# Patient Record
Sex: Male | Born: 1960 | Race: White | Hispanic: No | Marital: Married | Smoking: Former smoker
Health system: Southern US, Community
[De-identification: ages and names within clinical notes are randomized; demographics above are authoritative.]

## PROBLEM LIST (undated history)

## (undated) DIAGNOSIS — M545 Low back pain: Secondary | ICD-10-CM

## (undated) DIAGNOSIS — IMO0002 Reserved for concepts with insufficient information to code with codable children: Secondary | ICD-10-CM

## (undated) DIAGNOSIS — D2330 Other benign neoplasm of skin of unspecified part of face: Secondary | ICD-10-CM

## (undated) DIAGNOSIS — I10 Essential (primary) hypertension: Secondary | ICD-10-CM

## (undated) DIAGNOSIS — M48061 Spinal stenosis, lumbar region without neurogenic claudication: Secondary | ICD-10-CM

## (undated) DIAGNOSIS — D2360 Other benign neoplasm of skin of unspecified upper limb, including shoulder: Secondary | ICD-10-CM

## (undated) DIAGNOSIS — J45909 Unspecified asthma, uncomplicated: Secondary | ICD-10-CM

## (undated) DIAGNOSIS — Z9889 Other specified postprocedural states: Secondary | ICD-10-CM

## (undated) DIAGNOSIS — E785 Hyperlipidemia, unspecified: Secondary | ICD-10-CM

## (undated) DIAGNOSIS — R112 Nausea with vomiting, unspecified: Secondary | ICD-10-CM

## (undated) HISTORY — DX: Spinal stenosis, lumbar region without neurogenic claudication: M48.061

## (undated) HISTORY — DX: Low back pain: M54.5

## (undated) HISTORY — DX: Nausea with vomiting, unspecified: R11.2

## (undated) HISTORY — PX: APPENDECTOMY: SHX54

## (undated) HISTORY — DX: Other benign neoplasm of skin of unspecified upper limb, including shoulder: D23.60

## (undated) HISTORY — PX: OTHER SURGICAL HISTORY: SHX169

## (undated) HISTORY — DX: Essential (primary) hypertension: I10

## (undated) HISTORY — PX: CARPAL TUNNEL RELEASE: SHX101

## (undated) HISTORY — DX: Other benign neoplasm of skin of unspecified part of face: D23.30

## (undated) HISTORY — DX: Hyperlipidemia, unspecified: E78.5

## (undated) HISTORY — DX: Other specified postprocedural states: Z98.890

## (undated) HISTORY — DX: Reserved for concepts with insufficient information to code with codable children: IMO0002

## (undated) HISTORY — DX: Unspecified asthma, uncomplicated: J45.909

---

## 2005-04-06 ENCOUNTER — Ambulatory Visit (HOSPITAL_BASED_OUTPATIENT_CLINIC_OR_DEPARTMENT_OTHER): Admission: RE | Admit: 2005-04-06 | Discharge: 2005-04-06 | Payer: Self-pay | Admitting: Orthopedic Surgery

## 2005-04-06 ENCOUNTER — Ambulatory Visit (HOSPITAL_COMMUNITY): Admission: RE | Admit: 2005-04-06 | Discharge: 2005-04-06 | Payer: Self-pay | Admitting: Orthopedic Surgery

## 2007-12-13 ENCOUNTER — Ambulatory Visit (HOSPITAL_BASED_OUTPATIENT_CLINIC_OR_DEPARTMENT_OTHER): Admission: RE | Admit: 2007-12-13 | Discharge: 2007-12-13 | Payer: Self-pay | Admitting: Orthopedic Surgery

## 2008-04-10 ENCOUNTER — Ambulatory Visit (HOSPITAL_BASED_OUTPATIENT_CLINIC_OR_DEPARTMENT_OTHER): Admission: RE | Admit: 2008-04-10 | Discharge: 2008-04-10 | Payer: Self-pay | Admitting: Orthopedic Surgery

## 2011-01-19 NOTE — Op Note (Signed)
Terry Stanley, Terry Stanley               ACCOUNT NO.:  [id number]   MEDICAL RECORD NO.:  [id number]          PATIENT TYPE:  AMB   LOCATION:  DSC                          FACILITY:  [facility]   PHYSICIAN:  Matthew A. Weingold, M.D.DATE OF BIRTH:  11-16-1960   DATE OF PROCEDURE:  04/10/2008  DATE OF DISCHARGE:  04/10/2008                               OPERATIVE REPORT   PREOPERATIVE DIAGNOSIS:  Left ring finger proximal phalangeal fracture,  intra-articular.   POSTOPERATIVE DIAGNOSIS:  Left ring finger proximal phalangeal fracture,  intra-articular.   PROCEDURE:  Open reduction and internal fixation of above using 1.5-mm  screws, one 9 and one 11 mm.   SURGEON:  Artist Pais. Mina Marble, MD   ASSISTANT:  None.   ANESTHESIA:  General.   TOURNIQUET TIME:  43 minutes.   COMPLICATIONS:  No complications.   DRAINS:  No drains.   OPERATIVE REPORT:  The patient was taken to the operating suite.  After  induction of general anesthesia, the left upper extremity was prepped  and draped in sterile fashion.  An Esmarch used to exsanguinate the  limb.  Tourniquet was inflated to 250 mmHg.  At this point in time, an  incision was made over the radial side metacarpal phalangeal joint ring  finger.  The extensor tendon was split longitudinally over the MP joint.  Dissection was carried down to the radial side of the  metacarpophalangeal joint where there is a volarly displaced fragment  comprising about 35-40% of joint surface, it was reduced with a  longitudinal traction and pressure was held from dorsal to volar.  It  was then fixed with two 1.5 mm screws in dorsal volar, one 9 and one 11  mm.  Intraoperative fluoroscopy revealed near-anatomic reduction in both  AP and lateral oblique views. The wound was irrigated and loosely closed  in layers of 4-0 Vicryl for the capsule, 4-0 Mersilene to the extensor  mechanism, and 3-0 Prolene subcuticular stitch on the skin.  The patient  was placed in sterile  dressing of 4x4s fluffs and a volar splint.  The  patient tolerated the procedure well, went to recovery room in stable  fashion.      Artist Pais Mina Marble, M.D.  Electronically Signed     MAW/MEDQ  D:  04/12/2008  T:  04/13/2008  Job:  [id number]

## 2011-01-19 NOTE — Op Note (Signed)
Terry Stanley, Terry Stanley               ACCOUNT NO.:  [id number]   MEDICAL RECORD NO.:  [id number]          PATIENT TYPE:  AMB   LOCATION:  DSC                          FACILITY:  [facility]   PHYSICIAN:  Matthew A. Weingold, M.D.DATE OF BIRTH:  11-09-1960   DATE OF PROCEDURE:  12/13/2007  DATE OF DISCHARGE:                               OPERATIVE REPORT   PREOP DIAGNOSIS:  Chronic right wrist pain.   POSTOP DIAGNOSIS:  Chronic right wrist pain.   PROCEDURE:  Right wrist arthroscopy, debridement of grade 1 SL tear and  grade 1 LT tear.   SURGEON:  Artist Pais. Mina Marble, MD   ASSISTANT:  None.   ANESTHESIA:  General.   TOURNIQUET TIME:  31 minutes.   COMPLICATIONS:  None.   OPERATIVE REPORT:  The patient was taken to operating suite.  After  induction of adequate general anesthesia, the right upper extremity was  prepped and draped in sterile fashion.  An Esmarch was used to  exsanguinate the limb.  Tourniquet was inflated to 250 mmHg.  At this  point in time, the patient's right upper extremity was padded and placed  in a wrist tower with 15 pounds countertraction across the radiocarpal  joint with the wrist in slight flexion.  A standard 3/4 Arthrex  arthroscope portal was established 1 cm distal to Lister's tubercle.  Skin was incised sharply.  Blunt dissection was carried down into the  capsule.  The joint was violated and the scope was introduced into the  joint.  Visualization revealed intact radial side ligaments with grade 1  SL tear and what appeared to be a grade 1 lunotriquetral tear.  The TFCC  looked to be intact grossly.  An 18-gauge needle was then used to  establish a 6U outflow portal that was followed by a 4/5 working portal  under direct vision.  Using the 4/5 working portal, the LT tear was  debrided using a 2.9 suction shaver down to stable rim.  There was no  instability noted with probing of the lunate or triquetrum.  The scope  was then placed into the 4/5  portal and instruments from the 3/4 portal,  and SL ligament was debrided using the same 2.9 suction shaver.  At the  end of procedure, a thorough inspection of radiocarpal joint was  undertaken.  No other osteocartilaginous bleedings were noted.  The  joint was irrigated via the inflow and outflow cannulas.  Instruments  were removed.  Celestone 1 mL was directed intra-articularly through the  18-gauge needle to the 6U outflow portal followed by portal closure of 4-  0 Vicryl Rapide.  Fluffs 4x4 and a volar splint was applied.  The  patient tolerated the procedure well.     Artist Pais Mina Marble, M.D.  Electronically Signed    MAW/MEDQ  D:  12/13/2007  T:  12/14/2007  Job:  [id number]

## 2011-01-22 NOTE — Op Note (Signed)
Terry Stanley, Terry Stanley               ACCOUNT NO.:  [id number]   MEDICAL RECORD NO.:  [id number]          PATIENT TYPE:  AMB   LOCATION:  DSC                          FACILITY:  [facility]   PHYSICIAN:  Robert A. Thurston Hole, M.D. DATE OF BIRTH:  18-Dec-1960   DATE OF PROCEDURE:  04/06/2005  DATE OF DISCHARGE:                                 OPERATIVE REPORT   PREOPERATIVE DIAGNOSIS:  Left elbow lateral epicondylitis with partial tear.   POSTOPERATIVE DIAGNOSIS:  Left elbow lateral epicondylitis with partial  tear.   PROCEDURE:  Left elbow lateral release, debridement and repair, with partial  lateral epicondylectomy.   SURGEON:  Elana Alm. Thurston Hole, M.D.   ASSISTANT:  Julien Girt, P.A.   ANESTHESIA:  General.   OPERATIVE TIME:  45 minutes.   COMPLICATIONS:  None.   INDICATIONS FOR PROCEDURE:  Terry Stanley is a 50 year old gentleman who has had  significant left elbow pain for the past three years, increasing in nature,  with exam and MRI documenting a lateral epicondylitis and partial tear of  the lateral  epicondylar tendons, who has failed conservative care is now to  undergo debridement and repair.   DESCRIPTION:  Terry Stanley was brought to operating room on April 06, 2005,  placed on the operative table in supine position.  After an adequate level  of general anesthesia was obtained, his left elbow was examined.  He had  full range of  motion his elbow was stable to ligamentous exam.  His left  arm was prepped using sterile DuraPrep and draped using sterile technique.  The arm was exsanguinated and a tourniquet elevated to 250 mm.  Initially  through a 3 cm curvilinear incision based over the lateral epicondyle,  initial was made, underlying subcutaneous tissues were incised in line with  the as skin incision.  The fascia over the lateral epicondylar tendons was  incised longitudinally, revealing the underlying lateral epicondylar  tendons.  Found to have partial tearing and  tendinosis.  The lateral  epicondylar tendons were released off their lateral condyle attachment and  debrided back to more healthy-appearing tendon, radial nerve carefully  protected.  The ECRB and a ECRL tendons were completely released.  Found to  have a lateral epicondyle spur, and this was resected.  Multiple small drill  holes were placed in the lateral epicondyle and then through two of these  drill holes 2-0 Fibrewire suture was placed in a mattress suture technique,  reattaching the lateral epicondylar tendons back down to the lateral  epicondyle approximately 3-4 mm distal to their initial attachment under  less tension.  The radial head-capitellar joint was not entered.  After this  was done, there was found to be firm and tight repair.  The wound was then  irrigated and then the fascia over this repair was closed with 3-0 Vicryl  suture, subcutaneous tissues closed with through 3-0 Vicryl, subcuticular  layer closed with 3-0 Monocryl.  Steri-Strips were applied.  The wound  injected with 0.25% Marcaine.  Sterile dressings and a long-arm splint  applied and then the patient awakened and taken to  the recovery room in  stable condition.   FOLLOW-UP CARE:  Terry Stanley will be followed as an outpatient on Percocet for  pain.  See me back in the office in a week for wound check and follow-up.       RAW/MEDQ  D:  04/06/2005  T:  04/07/2005  Job:  [id number]

## 2011-06-01 LAB — BASIC METABOLIC PANEL
BUN: 8
CO2: 26
Calcium: 9.4
Chloride: 105
Creatinine, Ser: 0.9
GFR calc Af Amer: >60
GFR calc non Af Amer: >60
Glucose, Bld: 98
Potassium: 4.4
Sodium: 137

## 2011-06-01 LAB — POCT HEMOGLOBIN-HEMACUE
Hemoglobin: 14
Hemoglobin: 16.1

## 2011-06-04 LAB — POCT HEMOGLOBIN-HEMACUE: Hemoglobin: 15.2

## 2011-10-15 DIAGNOSIS — M545 Low back pain, unspecified: Secondary | ICD-10-CM

## 2011-10-15 DIAGNOSIS — IMO0002 Reserved for concepts with insufficient information to code with codable children: Secondary | ICD-10-CM

## 2011-10-15 DIAGNOSIS — E785 Hyperlipidemia, unspecified: Secondary | ICD-10-CM

## 2011-10-15 DIAGNOSIS — M48061 Spinal stenosis, lumbar region without neurogenic claudication: Secondary | ICD-10-CM

## 2011-10-15 HISTORY — DX: Spinal stenosis, lumbar region without neurogenic claudication: M48.061

## 2011-10-15 HISTORY — DX: Low back pain, unspecified: M54.50

## 2011-10-15 HISTORY — DX: Reserved for concepts with insufficient information to code with codable children: IMO0002

## 2011-10-15 HISTORY — DX: Hyperlipidemia, unspecified: E78.5

## 2015-09-07 HISTORY — PX: NECK SURGERY: SHX720

## 2016-01-30 DIAGNOSIS — D2360 Other benign neoplasm of skin of unspecified upper limb, including shoulder: Secondary | ICD-10-CM

## 2016-01-30 HISTORY — DX: Other benign neoplasm of skin of unspecified upper limb, including shoulder: D23.60

## 2016-08-23 DIAGNOSIS — I10 Essential (primary) hypertension: Secondary | ICD-10-CM | POA: Insufficient documentation

## 2016-08-23 DIAGNOSIS — Z01810 Encounter for preprocedural cardiovascular examination: Secondary | ICD-10-CM | POA: Insufficient documentation

## 2016-08-23 DIAGNOSIS — I1A0 Resistant hypertension: Secondary | ICD-10-CM

## 2016-08-23 HISTORY — DX: Encounter for preprocedural cardiovascular examination: Z01.810

## 2016-08-23 HISTORY — DX: Essential (primary) hypertension: I10

## 2016-08-23 HISTORY — DX: Resistant hypertension: I1A.0

## 2017-05-23 ENCOUNTER — Other Ambulatory Visit: Payer: Self-pay | Admitting: Cardiology

## 2017-05-23 NOTE — Telephone Encounter (Signed)
Left message for pt to return to see if pt will flup with Dr Bettina Gavia in [facility] or in [place]

## 2017-05-24 NOTE — Telephone Encounter (Signed)
Left message for pt to return call to see if pt will flup with Dr Bettina Gavia in [facility] or in [place]

## 2017-05-26 DIAGNOSIS — Z9889 Other specified postprocedural states: Secondary | ICD-10-CM

## 2017-05-26 DIAGNOSIS — D2330 Other benign neoplasm of skin of unspecified part of face: Secondary | ICD-10-CM | POA: Insufficient documentation

## 2017-05-26 DIAGNOSIS — J45909 Unspecified asthma, uncomplicated: Secondary | ICD-10-CM

## 2017-05-26 DIAGNOSIS — R112 Nausea with vomiting, unspecified: Secondary | ICD-10-CM

## 2017-05-26 HISTORY — DX: Unspecified asthma, uncomplicated: J45.909

## 2017-05-26 HISTORY — DX: Nausea with vomiting, unspecified: R11.2

## 2017-05-26 HISTORY — DX: Other specified postprocedural states: Z98.890

## 2017-05-26 HISTORY — DX: Other benign neoplasm of skin of unspecified part of face: D23.30

## 2017-06-28 NOTE — Progress Notes (Signed)
Cardiology Office Note:    Date:  06/29/2017   ID:  Terry Stanley, DOB 02-02-1961, MRN [id number]  PCP:  Street, Terry Mt, MD  Cardiologist:  Shirlee More, MD    Referring MD: [address] Terry Stanley, *    ASSESSMENT:    1. Essential hypertension   2. Hyperlipidemia, unspecified hyperlipidemia type    PLAN:    In order of problems listed above:  1. Not at target, he had inadvertently decrease his carvedilol 50% and will resume 12.5 mg twice daily and follow home blood pressure.  He will continue his other medications for resistant hypertension including clonidine and hydralazine and Micardis HCTZ.  We will check renal function and potassium today 2. Stable continue statin, check liver function for safety and lipids were efficacy   Next appointment: 6 months   Medication Adjustments/Labs and Tests Ordered: Current medicines are reviewed at length with the patient today.  Concerns regarding medicines are outlined above.  No orders of the defined types were placed in this encounter.  No orders of the defined types were placed in this encounter.   Chief Complaint  Patient presents with  . Follow-up    History of Present Illness:    Terry Stanley is a 56 y.o. male with a hx of resistant hypertension and hyperlipidenmia last seen 6 months ago.  He has not been checking blood pressure has been compliant with meds but in some fashion had decreased his carvedilol by 50%.  He recently had a flare of asthma but is improved and has had no angina or palpitation or syncope and presently is having no bronchospasm or shortness of breath. Compliance with diet, lifestyle and medications: Yes Past Medical History:  Diagnosis Date  . Asthma 05/26/2017  . Essential hypertension 08/23/2016  . Hyperlipidemia 10/15/2011  . Low back pain 10/15/2011  . 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 PONV (postoperative nausea and vomiting) 05/26/2017  . Spinal stenosis of  lumbar region 10/15/2011  . 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 Thoracic or lumbosacral neuritis or radiculitis 10/15/2011    Past Surgical History:  Procedure Laterality Date  . CARPAL TUNNEL RELEASE      Current Medications: Current Meds  Medication Sig  . aspirin EC 81 MG tablet Take 81 mg by mouth.  Marland Kitchen atorvastatin (LIPITOR) 40 MG tablet Take 40 mg by mouth.  . carvedilol (COREG) 6.25 MG tablet TAKE TWO (2) TABLETS BY MOUTH 2 TIMES DAILY WITH MEALS  . cloNIDine (CATAPRES) 0.1 MG tablet Take 0.1 mg by mouth.  . cyclobenzaprine (FLEXERIL) 10 MG tablet   . fluticasone (FLONASE) 50 MCG/ACT nasal spray   . hydrALAZINE (APRESOLINE) 50 MG tablet Take by mouth.  Marland Kitchen HYDROcodone-acetaminophen (NORCO) 10-325 MG tablet   . ibuprofen (ADVIL,MOTRIN) 200 MG tablet Take 600 mg by mouth.  . Omega-3 Fatty Acids (FISH OIL OMEGA-3 PO) Take by mouth.  Marland Kitchen omeprazole (PRILOSEC) 20 MG capsule Take by mouth.  Marland Kitchen PROAIR RESPICLICK 938 (90 Base) MCG/ACT AEPB   . telmisartan-hydrochlorothiazide (MICARDIS HCT) 80-12.5 MG tablet Take by mouth.  . traZODone (DESYREL) 50 MG tablet Take 50 mg by mouth.  . vitamin C (ASCORBIC ACID) 500 MG tablet Take 500 mg by mouth.     Allergies:   Bee venom   Social History   Social History  . Marital status: Married    Spouse name: N/A  . Number of children: N/A  . Years of education: N/A   Social History  Main Topics  . Smoking status: Former Research scientist (life sciences)  . Smokeless tobacco: Never Used  . Alcohol use 7.2 oz/week    12 Cans of beer per week  . Drug use: No  . Sexual activity: Not Asked   Other Topics Concern  . None   Social History Narrative  . None     Family History: The patient's family history is not on file. ROS:   Please see the history of present illness.   Worsened asthma recently. All other systems reviewed and are negative.  EKGs/Labs/Other Studies Reviewed:    The following studies were reviewed today:  Recent Labs: No results found for  requested labs within last 8760 hours.  Recent Lipid Panel No results found for: CHOL, TRIG, HDL, CHOLHDL, VLDL, LDLCALC, LDLDIRECT  Physical Exam:    VS:  BP (!) 160/104 (BP Location: Right Arm, Patient Position: Sitting, Cuff Size: Normal)   Pulse 73   Resp (!) 97   Ht 6' (1.829 m)   Wt 248 lb (112.5 kg)   BMI 33.63 kg/m     Wt Readings from Last 3 Encounters:  06/29/17 248 lb (112.5 kg)     GEN:  Well nourished, well developed in no acute distress HEENT: Normal NECK: No JVD; No carotid bruits LYMPHATICS: No lymphadenopathy CARDIAC: RRR, no murmurs, rubs, gallops RESPIRATORY:  Clear to auscultation without rales, wheezing or rhonchi  ABDOMEN: Soft, non-tender, non-distended MUSCULOSKELETAL:  No edema; No deformity  SKIN: Warm and dry NEUROLOGIC:  Alert and oriented x 3 PSYCHIATRIC:  Normal affect    Signed, Shirlee More, MD  06/29/2017 11:22 AM    [place]

## 2017-06-29 ENCOUNTER — Encounter: Payer: Self-pay | Admitting: Cardiology

## 2017-06-29 ENCOUNTER — Ambulatory Visit (INDEPENDENT_AMBULATORY_CARE_PROVIDER_SITE_OTHER): Payer: 59 | Admitting: Cardiology

## 2017-06-29 VITALS — BP 159/90 | HR 73 | Resp 97 | Ht 72.0 in | Wt 248.0 lb

## 2017-06-29 DIAGNOSIS — E785 Hyperlipidemia, unspecified: Secondary | ICD-10-CM | POA: Diagnosis not present

## 2017-06-29 DIAGNOSIS — I10 Essential (primary) hypertension: Secondary | ICD-10-CM | POA: Diagnosis not present

## 2017-06-29 NOTE — Patient Instructions (Addendum)
Medication Instructions:  Your physician recommends that you continue on your current medications as directed. Please refer to the Current Medication list given to you today.  Labwork: Your physician recommends that you return for lab work today. CMP, lipid  Testing/Procedures: None  Follow-Up: Your physician wants you to follow-up in: 6 months. You will receive a reminder letter in the mail two months in advance. If you don't receive a letter, please call our office to schedule the follow-up appointment.  Any Other Special Instructions Will Be Listed Below (If Applicable).     If you need a refill on your cardiac medications before your next appointment, please call your pharmacy.    Check your home BP and call if remains . 150/90

## 2017-06-30 LAB — COMPREHENSIVE METABOLIC PANEL
A/G RATIO: 1.8 (ref 1.2–2.2)
ALBUMIN: 4.9 g/dL (ref 3.5–5.5)
ALK PHOS: 103 IU/L (ref 39–117)
ALT: 23 IU/L (ref 0–44)
AST: 18 IU/L (ref 0–40)
BILIRUBIN TOTAL: 0.5 mg/dL (ref 0.0–1.2)
BUN / CREAT RATIO: 14 (ref 9–20)
BUN: 15 mg/dL (ref 6–24)
CO2: 24 mmol/L (ref 20–29)
Calcium: 9.9 mg/dL (ref 8.7–10.2)
Chloride: 97 mmol/L (ref 96–106)
Creatinine, Ser: 1.07 mg/dL (ref 0.76–1.27)
GFR calc Af Amer: 89 mL/min/{1.73_m2} (ref >59)
GFR calc non Af Amer: 77 mL/min/{1.73_m2} (ref >59)
GLOBULIN, TOTAL: 2.8 g/dL (ref 1.5–4.5)
Glucose: 104 mg/dL — ABNORMAL HIGH (ref 65–99)
POTASSIUM: 4.5 mmol/L (ref 3.5–5.2)
SODIUM: 135 mmol/L (ref 134–144)
Total Protein: 7.7 g/dL (ref 6.0–8.5)

## 2017-06-30 LAB — LIPID PANEL
CHOLESTEROL TOTAL: 192 mg/dL (ref 100–199)
Chol/HDL Ratio: 4.1 ratio (ref 0.0–5.0)
HDL: 47 mg/dL (ref >39)
LDL Calculated: 107 mg/dL — ABNORMAL HIGH (ref 0–99)
TRIGLYCERIDES: 190 mg/dL — AB (ref 0–149)
VLDL Cholesterol Cal: 38 mg/dL (ref 5–40)

## 2017-07-11 ENCOUNTER — Telehealth: Payer: Self-pay | Admitting: Cardiology

## 2017-07-11 ENCOUNTER — Other Ambulatory Visit: Payer: Self-pay | Admitting: Cardiology

## 2017-07-11 DIAGNOSIS — I2729 Other secondary pulmonary hypertension: Secondary | ICD-10-CM

## 2017-07-11 MED ORDER — MINOXIDIL 2.5 MG PO TABS
2.5000 mg | ORAL_TABLET | Freq: Every day | ORAL | 3 refills | Status: DC
Start: 2017-07-11 — End: 2018-01-23

## 2017-07-11 NOTE — Telephone Encounter (Signed)
150/90 is the best his blood pressure has been. 172/104 currently, he is at [facility] because of asthma. They did give him a breathing treatment, but were concerned with his blood pressure. Patient did state he ran out of one of his blood pressure medications this morning and planned to pick it up after his visit. Please advise.

## 2017-07-11 NOTE — Telephone Encounter (Signed)
His BP is still really high and he was told to call in about it

## 2017-07-11 NOTE — Telephone Encounter (Signed)
Patient advised to start minoxidil 2.5 mg daily. Prescription sent to [facility]. 2 week appointment scheduled for 07/25/17 at 9:40 am. Patient verbalized understanding of medication adjustment and appointment date and time.

## 2017-07-11 NOTE — Telephone Encounter (Signed)
He has resistant hypertension takes multiple medications and she is started on minoxidil 2.5 mg daily and see him in the office in about 2 weeks

## 2017-07-12 NOTE — Telephone Encounter (Signed)
yes

## 2017-07-12 NOTE — Telephone Encounter (Signed)
Patient advised to take minoxidil as prescribed when it is received by the pharmacy. Patient verbalized understanding.

## 2017-07-12 NOTE — Telephone Encounter (Signed)
Patient called to inform Dr. Bettina Gavia that the blood pressure medication called in for him yesterday he has not taken because the pharmacy had to order it. Now, his blood pressure is back down to 159/91, which is around what it was at his last office visit. Patient states that he believes his blood pressure may have been high at his PCP yesterday because of having to use his rescue inhaler so much and then receiving a breathing treatment in the office. Patient would like to know if he still needs to take the new medication. Please advise.

## 2017-07-21 NOTE — Progress Notes (Signed)
Cardiology Office Note:    Date:  07/25/2017   ID:  Terry Stanley, DOB [birth year], MRN [id number]  PCP:  Street, [name], MD  Cardiologist:  Shirlee More, MD    Referring MD: [address] [name], *    ASSESSMENT:    1. Resistant hypertension    PLAN:    In order of problems listed above:  1. Improved blood pressure at target quickly after adding minoxidil to his multidrug regimen.  His biggest problem now is a flare of asthma and after discussion we switch from nonselective to selective beta-blocker to mitigate his asthma.   Next appointment: 3 months   Medication Adjustments/Labs and Tests Ordered: Current medicines are reviewed at length with the patient today.  Concerns regarding medicines are outlined above.  No orders of the defined types were placed in this encounter.  No orders of the defined types were placed in this encounter.   Chief Complaint  Patient presents with  . Follow-up    medication    History of Present Illness:    Terry Stanley is a 56 y.o. male with a hx of multi drug resistant hypertension recently started on minoxidil last seen one month ago. He was started on minoxidil and home BP now 130/80.He has had no edema his asthma has worsened. Compliance with diet, lifestyle and medications: Yes Quickly his blood pressure is improved and now is in the range of 130/80 at home checked by his wife who is an Therapist, sports.  He said no side effects from low-dose minoxidil no edema orthopnea palpitation.  Unfortunately has had a flare of asthma.  He has no exertional shortness of breath with wheezing orthopnea PND Past Medical History:  Diagnosis Date  . Asthma 05/26/2017  . Essential hypertension 08/23/2016  . Hyperlipidemia 10/15/2011  . Low back pain 10/15/2011  . 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 PONV (postoperative nausea and vomiting) 05/26/2017  . Spinal stenosis of lumbar region 10/15/2011  . Squamous acanthoma of face  05/26/2017  . Thoracic or lumbosacral neuritis or radiculitis 10/15/2011    Past Surgical History:  Procedure Laterality Date  . APPENDECTOMY    . CARPAL TUNNEL RELEASE    . NECK SURGERY  2017   fatty tumer removed  . thumb surgery Right     Current Medications: Current Meds  Medication Sig  . aspirin EC 81 MG tablet Take 81 mg by mouth.  Marland Kitchen atorvastatin (LIPITOR) 40 MG tablet Take 40 mg by mouth.  Marland Kitchen BREO ELLIPTA 200-25 MCG/INH AEPB   . carvedilol (COREG) 6.25 MG tablet TAKE TWO (2) TABLETS BY MOUTH 2 TIMES DAILY WITH MEALS  . cloNIDine (CATAPRES) 0.1 MG tablet Take 0.1 mg by mouth.  . cyclobenzaprine (FLEXERIL) 10 MG tablet   . fluticasone (FLONASE) 50 MCG/ACT nasal spray   . hydrALAZINE (APRESOLINE) 50 MG tablet Take by mouth 2 (two) times daily.   Marland Kitchen HYDROcodone-acetaminophen (NORCO) 10-325 MG tablet   . ibuprofen (ADVIL,MOTRIN) 200 MG tablet Take 600 mg by mouth.  . minoxidil (LONITEN) 2.5 MG tablet Take 1 tablet (2.5 mg total) daily by mouth.  . Omega-3 Fatty Acids (FISH OIL OMEGA-3 PO) Take by mouth.  Marland Kitchen omeprazole (PRILOSEC) 20 MG capsule Take by mouth.  Marland Kitchen PROAIR RESPICLICK 970 (90 Base) MCG/ACT AEPB   . telmisartan-hydrochlorothiazide (MICARDIS HCT) 80-12.5 MG tablet Take by mouth.  . traZODone (DESYREL) 50 MG tablet Take 50 mg by mouth.  . vitamin C (ASCORBIC ACID) 500 MG  tablet Take 500 mg by mouth.     Allergies:   Bee venom   Social History   Socioeconomic History  . Marital status: Married    Spouse name: None  . Number of children: None  . Years of education: None  . Highest education level: None  Social Needs  . Financial resource strain: None  . Food insecurity - worry: None  . Food insecurity - inability: None  . Transportation needs - medical: None  . Transportation needs - non-medical: None  Occupational History  . None  Tobacco Use  . Smoking status: Former Research scientist (life sciences)  . Smokeless tobacco: Never Used  Substance and Sexual Activity  . Alcohol use: Yes     Alcohol/week: 7.2 oz    Types: 12 Cans of beer per week  . Drug use: No  . Sexual activity: None  Other Topics Concern  . None  Social History Narrative  . None     Family History: The patient's family history includes Angina in his mother; Hypertension in his brother. ROS:   Please see the history of present illness.    All other systems reviewed and are negative.  EKGs/Labs/Other Studies Reviewed:    The following studies were reviewed today:   Recent Labs: 06/29/2017: ALT 23; BUN 15; Creatinine, Ser 1.07; Potassium 4.5; Sodium 135  Recent Lipid Panel    Component Value Date/Time   CHOL 192 06/29/2017 1155   TRIG 190 (H) 06/29/2017 1155   HDL 47 06/29/2017 1155   CHOLHDL 4.1 06/29/2017 1155   LDLCALC 107 (H) 06/29/2017 1155    Physical Exam:    VS:  BP 134/80 (BP Location: Right Arm, Patient Position: Sitting, Cuff Size: Normal)   Pulse 76   Ht 6' (1.829 m)   Wt 252 lb (114.3 kg)   SpO2 98%   BMI 34.18 kg/m     Wt Readings from Last 3 Encounters:  07/25/17 252 lb (114.3 kg)  06/29/17 248 lb (112.5 kg)     GEN:  Well nourished, well developed in no acute distress HEENT: Normal NECK: No JVD; No carotid bruits LYMPHATICS: No lymphadenopathy CARDIAC: RRR, no murmurs, rubs, gallops RESPIRATORY:  Clear to auscultation without rales, wheezing or rhonchi  ABDOMEN: Soft, non-tender, non-distended MUSCULOSKELETAL:  No edema; No deformity  SKIN: Warm and dry NEUROLOGIC:  Alert and oriented x 3 PSYCHIATRIC:  Normal affect    Signed, Shirlee More, MD  07/25/2017 9:51 AM    [place]

## 2017-07-25 ENCOUNTER — Encounter: Payer: Self-pay | Admitting: Cardiology

## 2017-07-25 ENCOUNTER — Ambulatory Visit: Payer: 59 | Admitting: Cardiology

## 2017-07-25 VITALS — BP 134/80 | HR 76 | Ht 72.0 in | Wt 252.0 lb

## 2017-07-25 DIAGNOSIS — I10 Essential (primary) hypertension: Secondary | ICD-10-CM

## 2017-07-25 MED ORDER — METOPROLOL TARTRATE 25 MG PO TABS
25.0000 mg | ORAL_TABLET | Freq: Two times a day (BID) | ORAL | 6 refills | Status: DC
Start: 2017-07-25 — End: 2018-03-08

## 2017-07-25 NOTE — Patient Instructions (Addendum)
Medication Instructions:  Your physician has recommended you make the following change in your medication:  STOP carvedilol START metoprolol (Lopressor) 25 mg twice daily  Labwork: None  Testing/Procedures: None  Follow-Up: Your physician wants you to follow-up in: 3 months. You will receive a reminder letter in the mail two months in advance. If you don't receive a letter, please call our office to schedule the follow-up appointment.  Any Other Special Instructions Will Be Listed Below (If Applicable).     If you need a refill on your cardiac medications before your next appointment, please call your pharmacy.    Asthma, Adult Asthma is a condition of the lungs in which the airways tighten and narrow. Asthma can make it hard to breathe. Asthma cannot be cured, but medicine and lifestyle changes can help control it. Asthma may be started (triggered) by:  Animal skin flakes (dander).  Dust.  Cockroaches.  Pollen.  Mold.  Smoke.  Cleaning products.  Hair sprays or aerosol sprays.  Paint fumes or strong smells.  Cold air, weather changes, and winds.  Crying or laughing hard.  Stress.  Certain medicines or drugs.  Foods, such as dried fruit, potato chips, and sparkling grape juice.  Infections or conditions (colds, flu).  Exercise.  Certain medical conditions or diseases.  Exercise or tiring activities.  Follow these instructions at home:  Take medicine as told by your doctor.  Use a peak flow meter as told by your doctor. A peak flow meter is a tool that measures how well the lungs are working.  Record and keep track of the peak flow meter's readings.  Understand and use the asthma action plan. An asthma action plan is a written plan for taking care of your asthma and treating your attacks.  To help prevent asthma attacks: ? Do not smoke. Stay away from secondhand smoke. ? Change your heating and air conditioning filter often. ? Limit your use  of fireplaces and wood stoves. ? Get rid of pests (such as roaches and mice) and their droppings. ? Throw away plants if you see mold on them. ? Clean your floors. Dust regularly. Use cleaning products that do not smell. ? Have someone vacuum when you are not home. Use a vacuum cleaner with a HEPA filter if possible. ? Replace carpet with wood, tile, or vinyl flooring. Carpet can trap animal skin flakes and dust. ? Use allergy-proof pillows, mattress covers, and box spring covers. ? Wash bed sheets and blankets every week in hot water and dry them in a dryer. ? Use blankets that are made of polyester or cotton. ? Clean bathrooms and kitchens with bleach. If possible, have someone repaint the walls in these rooms with mold-resistant paint. Keep out of the rooms that are being cleaned and painted. ? Wash hands often. Contact a doctor if:  You have make a whistling sound when breaking (wheeze), have shortness of breath, or have a cough even if taking medicine to prevent attacks.  The colored mucus you cough up (sputum) is thicker than usual.  The colored mucus you cough up changes from clear or white to yellow, green, gray, or bloody.  You have problems from the medicine you are taking such as: ? A rash. ? Itching. ? Swelling. ? Trouble breathing.  You need reliever medicines more than 2-3 times a week.  Your peak flow measurement is still at 50-79% of your personal best after following the action plan for 1 hour.  You have a fever.  Get help right away if:  You seem to be worse and are not responding to medicine during an asthma attack.  You are short of breath even at rest.  You get short of breath when doing very little activity.  You have trouble eating, drinking, or talking.  You have chest pain.  You have a fast heartbeat.  Your lips or fingernails start to turn blue.  You are light-headed, dizzy, or faint.  Your peak flow is less than 50% of your personal  best. This information is not intended to replace advice given to you by your health care provider. Make sure you discuss any questions you have with your health care provider. Document Released: 02/09/2008 Document Revised: 01/29/2016 Document Reviewed: 03/22/2013 Elsevier Interactive Patient Education  2017 [employer].

## 2017-08-10 ENCOUNTER — Telehealth: Payer: Self-pay | Admitting: Cardiology

## 2017-08-10 NOTE — Telephone Encounter (Signed)
**  FYI ONLY**  Just wanted to let Dr Bettina Gavia know that him changing his BP medicine has helped tremendously and he wanted to thank him for that

## 2017-08-11 NOTE — Telephone Encounter (Signed)
Message routed to Dr. Bettina Gavia for update.

## 2017-09-09 ENCOUNTER — Other Ambulatory Visit: Payer: Self-pay | Admitting: Cardiology

## 2017-12-26 ENCOUNTER — Ambulatory Visit: Payer: 59 | Admitting: Cardiology

## 2018-01-07 ENCOUNTER — Other Ambulatory Visit: Payer: Self-pay | Admitting: Cardiology

## 2018-01-23 ENCOUNTER — Ambulatory Visit (INDEPENDENT_AMBULATORY_CARE_PROVIDER_SITE_OTHER): Payer: Managed Care, Other (non HMO) | Admitting: Cardiology

## 2018-01-23 ENCOUNTER — Encounter: Payer: Self-pay | Admitting: Cardiology

## 2018-01-23 VITALS — BP 148/92 | HR 63 | Ht 72.0 in | Wt 236.1 lb

## 2018-01-23 DIAGNOSIS — E7800 Pure hypercholesterolemia, unspecified: Secondary | ICD-10-CM | POA: Diagnosis not present

## 2018-01-23 DIAGNOSIS — I10 Essential (primary) hypertension: Secondary | ICD-10-CM | POA: Diagnosis not present

## 2018-01-23 MED ORDER — MINOXIDIL 2.5 MG PO TABS: 2.5000 mg | ORAL_TABLET | Freq: Two times a day (BID) | ORAL | 3 refills | Status: DC

## 2018-01-23 NOTE — Progress Notes (Signed)
Cardiology Office Note:    Date:  01/24/2018   ID:  Terry Stanley, DOB [birth year], MRN [id number]  PCP:  Street, Terry Mt, MD  Cardiologist:  Shirlee More, MD    Referring MD: [address], Terry Stanley, *    ASSESSMENT:    1. Resistant hypertension   2. Pure hypercholesterolemia    PLAN:    In order of problems listed above:  1. He has been less diligent checking his blood pressure but his systolics are now running in the range of 1 40-1 50.  Initially minoxidil was prescribed twice daily has been taking it once daily asked him to increase the frequency check serial home blood pressures and contact me if systolics remain greater than 140.  He will continue his other antihypertensives will recheck renal function potassium today 2. Stable continue statin check liver function and lipids today for safety and efficacy   Next appointment: 6 months   Medication Adjustments/Labs and Tests Ordered: Current medicines are reviewed at length with the patient today.  Concerns regarding medicines are outlined above.  Orders Placed This Encounter  Procedures  . Lipid Profile  . Comprehensive Metabolic Panel (CMET)   Meds ordered this encounter  Medications  . minoxidil (LONITEN) 2.5 MG tablet    Sig: Take 1 tablet (2.5 mg total) by mouth 2 (two) times daily.    Dispense:  90 tablet    Refill:  3    Chief Complaint  Patient presents with  . Follow-up  . Hypertension    History of Present Illness:    Terry Stanley is a 57 y.o. male with a hx of resistant hypertension last seen 07/25/17. Compliance with diet, lifestyle and medications: Yes He feels much improved no chest pain shortness of breath palpitation or syncope he is checking his home blood pressure sporadically and about half of the time is greater than 416 systolic.  His previous flare of asthma has resolved by switching to a selective beta-blocker Past Medical History:  Diagnosis Date  . Asthma 05/26/2017  . Essential  hypertension 08/23/2016  . Hyperlipidemia 10/15/2011  . Low back pain 10/15/2011  . 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 PONV (postoperative nausea and vomiting) 05/26/2017  . Spinal stenosis of lumbar region 10/15/2011  . 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 Thoracic or lumbosacral neuritis or radiculitis 10/15/2011    Past Surgical History:  Procedure Laterality Date  . APPENDECTOMY    . CARPAL TUNNEL RELEASE    . NECK SURGERY  2017   fatty tumer removed  . thumb surgery Right     Current Medications: Current Meds  Medication Sig  . atorvastatin (LIPITOR) 40 MG tablet Take 40 mg by mouth daily.   Marland Kitchen BREO ELLIPTA 200-25 MCG/INH AEPB Inhale 1 puff into the lungs daily.   . cloNIDine (CATAPRES) 0.1 MG tablet TAKE ONE TABLET BY MOUTH EVERY NIGHT AT BEDTIME  . cyclobenzaprine (FLEXERIL) 10 MG tablet Take 10 mg by mouth daily as needed for muscle spasms.   . fluticasone (FLONASE) 50 MCG/ACT nasal spray Place 1 spray into both nostrils daily as needed for allergies.   . hydrALAZINE (APRESOLINE) 50 MG tablet Take by mouth 2 (two) times daily.   Marland Kitchen HYDROcodone-acetaminophen (NORCO) 10-325 MG tablet 1 tablet every 6 (six) hours as needed for moderate pain.   Marland Kitchen ibuprofen (ADVIL,MOTRIN) 200 MG tablet Take 600 mg by mouth every 6 (six) hours as needed.   . metoprolol tartrate (LOPRESSOR) 25  MG tablet Take 1 tablet (25 mg total) 2 (two) times daily by mouth.  . minoxidil (LONITEN) 2.5 MG tablet Take 1 tablet (2.5 mg total) by mouth 2 (two) times daily.  . Omega-3 Fatty Acids (FISH OIL OMEGA-3 PO) Take by mouth.  Marland Kitchen omeprazole (PRILOSEC) 20 MG capsule Take 20 mg by mouth daily.   . pregabalin (LYRICA) 50 MG capsule Take 50 mg by mouth 2 (two) times daily.  Marland Kitchen PROAIR RESPICLICK 027 (90 Base) MCG/ACT AEPB Inhale 1 puff into the lungs every 6 (six) hours as needed (wheezing).   Marland Kitchen telmisartan-hydrochlorothiazide (MICARDIS HCT) 80-12.5 MG tablet TAKE ONE (1) TABLET BY MOUTH ONCE  DAILY  . traZODone (DESYREL) 50 MG tablet Take 25 mg by mouth at bedtime.   . vitamin C (ASCORBIC ACID) 500 MG tablet Take 500 mg by mouth daily.   . [DISCONTINUED] minoxidil (LONITEN) 2.5 MG tablet Take 1 tablet (2.5 mg total) daily by mouth.     Allergies:   Bee venom   Social History   Socioeconomic History  . Marital status: Married    Spouse name: Not on file  . Number of children: Not on file  . Years of education: Not on file  . Highest education level: Not on file  Occupational History  . Not on file  Social Needs  . Financial resource strain: Not on file  . Food insecurity:    Worry: Not on file    Inability: Not on file  . Transportation needs:    Medical: Not on file    Non-medical: Not on file  Tobacco Use  . Smoking status: Former Research scientist (life sciences)  . Smokeless tobacco: Never Used  Substance and Sexual Activity  . Alcohol use: Yes    Alcohol/week: 7.2 oz    Types: 12 Cans of beer per week  . Drug use: No  . Sexual activity: Not on file  Lifestyle  . Physical activity:    Days per week: Not on file    Minutes per session: Not on file  . Stress: Not on file  Relationships  . Social connections:    Talks on phone: Not on file    Gets together: Not on file    Attends religious service: Not on file    Active member of club or organization: Not on file    Attends meetings of clubs or organizations: Not on file    Relationship status: Not on file  Other Topics Concern  . Not on file  Social History Narrative  . Not on file     Family History: The patient's family history includes Angina in his mother; Hypertension in his brother. ROS:   Please see the history of present illness.    All other systems reviewed and are negative.  EKGs/Labs/Other Studies Reviewed:    The following studies were reviewed today:  Recent Labs: 01/23/2018: ALT 50; BUN 12; Creatinine, Ser 1.02; Potassium 5.2; Sodium 139  Recent Lipid Panel    Component Value Date/Time   CHOL 161  01/23/2018 1024   TRIG 97 01/23/2018 1024   HDL 56 01/23/2018 1024   CHOLHDL 2.9 01/23/2018 1024   LDLCALC 86 01/23/2018 1024    Physical Exam:    VS:  BP (!) 148/92 (BP Location: Right Arm, Patient Position: Sitting, Cuff Size: Large)   Pulse 63   Ht 6' (1.829 m)   Wt 236 lb 1.9 oz (107.1 kg)   SpO2 98%   BMI 32.02 kg/m  Wt Readings from Last 3 Encounters:  01/23/18 236 lb 1.9 oz (107.1 kg)  07/25/17 252 lb (114.3 kg)  06/29/17 248 lb (112.5 kg)     GEN:  Well nourished, well developed in no acute distress HEENT: Normal NECK: No JVD; No carotid bruits LYMPHATICS: No lymphadenopathy CARDIAC: RRR, no murmurs, rubs, gallops RESPIRATORY:  Clear to auscultation without rales, wheezing or rhonchi  ABDOMEN: Soft, non-tender, non-distended MUSCULOSKELETAL:  No edema; No deformity  SKIN: Warm and dry NEUROLOGIC:  Alert and oriented x 3 PSYCHIATRIC:  Normal affect    Signed, Shirlee More, MD  01/24/2018 7:44 AM    [place]

## 2018-01-23 NOTE — Patient Instructions (Addendum)
Medication Instructions:  Your physician has recommended you make the following change in your medication: INCREASE minoxidil to 2.5 mg twice daily  Check home blood pressure. Call in 2 weeks if top number remains greater than 140.  Labwork: Your physician recommends that you have the following labs drawn: Lipid, CMP  Testing/Procedures: None  Follow-Up: Your physician wants you to follow-up in: 6 months. You will receive a reminder letter in the mail two months in advance. If you don't receive a letter, please call our office to schedule the follow-up appointment.  Any Other Special Instructions Will Be Listed Below (If Applicable).     If you need a refill on your cardiac medications before your next appointment, please call your pharmacy.    Hypertension Hypertension is another name for high blood pressure. High blood pressure forces your heart to work harder to pump blood. This can cause problems over time. There are two numbers in a blood pressure reading. There is a top number (systolic) over a bottom number (diastolic). It is best to have a blood pressure below 120/80. Healthy choices can help lower your blood pressure. You may need medicine to help lower your blood pressure if:  Your blood pressure cannot be lowered with healthy choices.  Your blood pressure is higher than 130/80.  Follow these instructions at home: Eating and drinking  If directed, follow the DASH eating plan. This diet includes: ? Filling half of your plate at each meal with fruits and vegetables. ? Filling one quarter of your plate at each meal with whole grains. Whole grains include whole wheat pasta, brown rice, and whole grain bread. ? Eating or drinking low-fat dairy products, such as skim milk or low-fat yogurt. ? Filling one quarter of your plate at each meal with low-fat (lean) proteins. Low-fat proteins include fish, skinless chicken, eggs, beans, and tofu. ? Avoiding fatty meat, cured and  processed meat, or chicken with skin. ? Avoiding premade or processed food.  Eat less than 1,500 mg of salt (sodium) a day.  Limit alcohol use to no more than 1 drink a day for nonpregnant women and 2 drinks a day for men. One drink equals 12 oz of beer, 5 oz of wine, or 1 oz of hard liquor. Lifestyle  Work with your doctor to stay at a healthy weight or to lose weight. Ask your doctor what the best weight is for you.  Get at least 30 minutes of exercise that causes your heart to beat faster (aerobic exercise) most days of the week. This may include walking, swimming, or biking.  Get at least 30 minutes of exercise that strengthens your muscles (resistance exercise) at least 3 days a week. This may include lifting weights or pilates.  Do not use any products that contain nicotine or tobacco. This includes cigarettes and e-cigarettes. If you need help quitting, ask your doctor.  Check your blood pressure at home as told by your doctor.  Keep all follow-up visits as told by your doctor. This is important. Medicines  Take over-the-counter and prescription medicines only as told by your doctor. Follow directions carefully.  Do not skip doses of blood pressure medicine. The medicine does not work as well if you skip doses. Skipping doses also puts you at risk for problems.  Ask your doctor about side effects or reactions to medicines that you should watch for. Contact a doctor if:  You think you are having a reaction to the medicine you are taking.  You  have headaches that keep coming back (recurring).  You feel dizzy.  You have swelling in your ankles.  You have trouble with your vision. Get help right away if:  You get a very bad headache.  You start to feel confused.  You feel weak or numb.  You feel faint.  You get very bad pain in your: ? Chest. ? Belly (abdomen).  You throw up (vomit) more than once.  You have trouble breathing. Summary  Hypertension is  another name for high blood pressure.  Making healthy choices can help lower blood pressure. If your blood pressure cannot be controlled with healthy choices, you may need to take medicine. This information is not intended to replace advice given to you by your health care provider. Make sure you discuss any questions you have with your health care provider. Document Released: 02/09/2008 Document Revised: 07/21/2016 Document Reviewed: 07/21/2016 Elsevier Interactive Patient Education  [employer].

## 2018-01-24 LAB — COMPREHENSIVE METABOLIC PANEL
ALK PHOS: 77 IU/L (ref 39–117)
ALT: 50 IU/L — AB (ref 0–44)
AST: 40 IU/L (ref 0–40)
Albumin/Globulin Ratio: 2.1 (ref 1.2–2.2)
Albumin: 5.1 g/dL (ref 3.5–5.5)
BUN/Creatinine Ratio: 12 (ref 9–20)
BUN: 12 mg/dL (ref 6–24)
Bilirubin Total: 0.6 mg/dL (ref 0.0–1.2)
CALCIUM: 9.9 mg/dL (ref 8.7–10.2)
CHLORIDE: 98 mmol/L (ref 96–106)
CO2: 22 mmol/L (ref 20–29)
Creatinine, Ser: 1.02 mg/dL (ref 0.76–1.27)
GFR calc non Af Amer: 82 mL/min/{1.73_m2} (ref >59)
GFR, EST AFRICAN AMERICAN: 95 mL/min/{1.73_m2} (ref >59)
GLUCOSE: 83 mg/dL (ref 65–99)
Globulin, Total: 2.4 g/dL (ref 1.5–4.5)
Potassium: 5.2 mmol/L (ref 3.5–5.2)
Sodium: 139 mmol/L (ref 134–144)
Total Protein: 7.5 g/dL (ref 6.0–8.5)

## 2018-01-24 LAB — LIPID PANEL
CHOL/HDL RATIO: 2.9 ratio (ref 0.0–5.0)
Cholesterol, Total: 161 mg/dL (ref 100–199)
HDL: 56 mg/dL (ref >39)
LDL CALC: 86 mg/dL (ref 0–99)
Triglycerides: 97 mg/dL (ref 0–149)
VLDL CHOLESTEROL CAL: 19 mg/dL (ref 5–40)

## 2018-02-06 ENCOUNTER — Telehealth: Payer: Self-pay | Admitting: Cardiology

## 2018-02-06 DIAGNOSIS — I10 Essential (primary) hypertension: Secondary | ICD-10-CM

## 2018-02-06 MED ORDER — MINOXIDIL 2.5 MG PO TABS: 5.0000 mg | ORAL_TABLET | Freq: Two times a day (BID) | ORAL | 3 refills | Status: DC

## 2018-02-06 NOTE — Telephone Encounter (Signed)
His top number is not under 140 with his new BP medicine

## 2018-02-06 NOTE — Telephone Encounter (Signed)
Double the dose of minoxidil

## 2018-02-06 NOTE — Telephone Encounter (Signed)
Patient advised to increase dose to 5 mg twice daily. Patient verbalized understanding. No further questions. Refill sent to [facility] per patient request.

## 2018-02-06 NOTE — Telephone Encounter (Signed)
Please advise 

## 2018-03-08 ENCOUNTER — Other Ambulatory Visit: Payer: Self-pay | Admitting: Cardiology

## 2018-03-08 ENCOUNTER — Telehealth: Payer: Self-pay | Admitting: Cardiology

## 2018-03-08 DIAGNOSIS — I10 Essential (primary) hypertension: Secondary | ICD-10-CM

## 2018-03-08 NOTE — Telephone Encounter (Signed)
Minoxidil 5 mg tid

## 2018-03-08 NOTE — Telephone Encounter (Signed)
Spoke with patient and he states that his blood pressure has been running high since increasing minoxidil.   Patient states that his BP is fluctuating from 150/140 - 82/85.  BP medications were recently increased, and he hasn't noticed any changes.    Please advise.

## 2018-03-08 NOTE — Telephone Encounter (Signed)
Left message on patients cell phone for patient to return call.

## 2018-03-08 NOTE — Telephone Encounter (Signed)
Patient states that Dr Bettina Gavia up'd his BP med a few weeks ago and it has not gone down any. Please call patient

## 2018-03-10 ENCOUNTER — Other Ambulatory Visit: Payer: Self-pay

## 2018-03-10 MED ORDER — MINOXIDIL 10 MG PO TABS: 5.0000 mg | ORAL_TABLET | Freq: Three times a day (TID) | ORAL | 1 refills | Status: DC

## 2018-03-10 NOTE — Telephone Encounter (Signed)
Informed patient that his medication has been increased to 5 mg tid, refill for this medication has been sent.

## 2018-03-16 ENCOUNTER — Other Ambulatory Visit: Payer: Self-pay

## 2018-03-16 ENCOUNTER — Telehealth: Payer: Self-pay | Admitting: Cardiology

## 2018-03-16 MED ORDER — FUROSEMIDE 20 MG PO TABS: 20.0000 mg | ORAL_TABLET | Freq: Every day | ORAL | 2 refills | Status: DC

## 2018-03-16 NOTE — Telephone Encounter (Signed)
Patient has been having a lot of swelling in th his legs and and ankles in the evening. This has just started and they recently changed his BP meds and he is concerned because it is getting worse.

## 2018-03-16 NOTE — Telephone Encounter (Signed)
Patient states that he noticed his lower legs swelling since the increase of minoxidil. Furosemide 20 mg daily was started per Dr. Bettina Gavia.

## 2018-03-20 ENCOUNTER — Other Ambulatory Visit: Payer: Self-pay

## 2018-03-20 ENCOUNTER — Telehealth: Payer: Self-pay | Admitting: Cardiology

## 2018-03-20 NOTE — Telephone Encounter (Signed)
Patient states that his swelling was still not controlled with the lasix (he even increased the dose some). The patient did not like the side effects of the medication and does not wish to take the minoxadil any longer. Stated his legs were very swollen and hurting. Do you have an alternative therapy you wish to use?

## 2018-03-20 NOTE — Telephone Encounter (Signed)
No stop minoxidil and stay on furosemide

## 2018-03-20 NOTE — Telephone Encounter (Signed)
Informed patient to d/c minoxidil and stay on lasix. Will see patient 2 weeks.

## 2018-03-20 NOTE — Telephone Encounter (Signed)
Patient has decided to stop the BP med.  He states that even with the fluid pill he s till swelling and not feeling well so he stopped it. Please call patient.

## 2018-04-07 ENCOUNTER — Encounter: Payer: Self-pay | Admitting: Cardiology

## 2018-04-07 ENCOUNTER — Ambulatory Visit: Payer: Managed Care, Other (non HMO) | Admitting: Cardiology

## 2018-04-07 VITALS — BP 148/76 | HR 65 | Ht 72.0 in | Wt 229.0 lb

## 2018-04-07 DIAGNOSIS — R609 Edema, unspecified: Secondary | ICD-10-CM | POA: Insufficient documentation

## 2018-04-07 DIAGNOSIS — I10 Essential (primary) hypertension: Secondary | ICD-10-CM

## 2018-04-07 DIAGNOSIS — R600 Localized edema: Secondary | ICD-10-CM | POA: Diagnosis not present

## 2018-04-07 DIAGNOSIS — I1A0 Resistant hypertension: Secondary | ICD-10-CM

## 2018-04-07 HISTORY — DX: Edema, unspecified: R60.9

## 2018-04-07 NOTE — Progress Notes (Signed)
Cardiology Office Note:    Date:  04/07/2018   ID:  Terry Stanley, DOB Jun 02, 1961, MRN [id number]  PCP:  Street, [name], MD  Cardiologist:  Shirlee More, MD    Referring MD: [address], [name], *    ASSESSMENT:    1. Resistant hypertension   2. Localized edema    PLAN:    In order of problems listed above:  1. Improved home blood pressures running less than 140/80 his edema is markedly improved since he decreased minoxidil.  He was also taking pregabalin and will continue his current blood pressure medicine for the time being. 2. Resolved, in retrospect is due to a combination of pregabalin and high-dose minoxidil.  Yes   Next appointment: 3 months   Medication Adjustments/Labs and Tests Ordered: Current medicines are reviewed at length with the patient today.  Concerns regarding medicines are outlined above.  No orders of the defined types were placed in this encounter.  No orders of the defined types were placed in this encounter.   Chief Complaint  Patient presents with  . Hypertension    Follow up    History of Present Illness:    Terry Stanley is a 58 y.o. male with a hx of resistant hypertension and marked lower extremity edema with high-dose minoxidil in May 2019 Compliance with diet, lifestyle and medications: Yes He reduce minoxidil to 21/2 mg twice daily edema resolved Home blood pressures been less than 409 systolic.  He has lost 25 pounds is pleased with the quality of his life no shortness of breath or chest pain Past Medical History:  Diagnosis Date  . Asthma 05/26/2017  . Essential hypertension 08/23/2016  . Hyperlipidemia 10/15/2011  . Low back pain 10/15/2011  . 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 PONV (postoperative nausea and vomiting) 05/26/2017  . Spinal stenosis of lumbar region 10/15/2011  . 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 Thoracic or lumbosacral neuritis or radiculitis 10/15/2011    Past Surgical  History:  Procedure Laterality Date  . APPENDECTOMY    . CARPAL TUNNEL RELEASE    . NECK SURGERY  2017   fatty tumer removed  . thumb surgery Right     Current Medications: Current Meds  Medication Sig  . atorvastatin (LIPITOR) 40 MG tablet Take 40 mg by mouth daily.   Marland Kitchen BREO ELLIPTA 200-25 MCG/INH AEPB Inhale 1 puff into the lungs daily.   . cloNIDine (CATAPRES) 0.1 MG tablet TAKE ONE TABLET BY MOUTH EVERY NIGHT AT BEDTIME  . cyclobenzaprine (FLEXERIL) 10 MG tablet Take 10 mg by mouth daily as needed for muscle spasms.   . fluticasone (FLONASE) 50 MCG/ACT nasal spray Place 1 spray into both nostrils daily as needed for allergies.   . hydrALAZINE (APRESOLINE) 50 MG tablet Take by mouth 2 (two) times daily.   Marland Kitchen HYDROcodone-acetaminophen (NORCO) 10-325 MG tablet 1 tablet every 6 (six) hours as needed for moderate pain.   Marland Kitchen ibuprofen (ADVIL,MOTRIN) 200 MG tablet Take 600 mg by mouth every 6 (six) hours as needed.   . metoprolol tartrate (LOPRESSOR) 25 MG tablet TAKE ONE (1) TABLET BY MOUTH TWO (2) TIMES DAILY  . Omega-3 Fatty Acids (FISH OIL OMEGA-3 PO) Take by mouth.  Marland Kitchen omeprazole (PRILOSEC) 20 MG capsule Take 20 mg by mouth daily.   . pregabalin (LYRICA) 50 MG capsule Take 50 mg by mouth 2 (two) times daily.  Marland Kitchen PROAIR RESPICLICK 811 (90 Base) MCG/ACT AEPB Inhale 1 puff  into the lungs every 6 (six) hours as needed (wheezing).   . traZODone (DESYREL) 50 MG tablet Take 25 mg by mouth at bedtime.   . vitamin C (ASCORBIC ACID) 500 MG tablet Take 500 mg by mouth daily.      Allergies:   Bee venom   Social History   Socioeconomic History  . Marital status: Married    Spouse name: Not on file  . Number of children: Not on file  . Years of education: Not on file  . Highest education level: Not on file  Occupational History  . Not on file  Social Needs  . Financial resource strain: Not on file  . Food insecurity:    Worry: Not on file    Inability: Not on file  . Transportation  needs:    Medical: Not on file    Non-medical: Not on file  Tobacco Use  . Smoking status: Former Research scientist (life sciences)  . Smokeless tobacco: Never Used  Substance and Sexual Activity  . Alcohol use: Yes    Alcohol/week: 7.2 oz    Types: 12 Cans of beer per week  . Drug use: No  . Sexual activity: Not on file  Lifestyle  . Physical activity:    Days per week: Not on file    Minutes per session: Not on file  . Stress: Not on file  Relationships  . Social connections:    Talks on phone: Not on file    Gets together: Not on file    Attends religious service: Not on file    Active member of club or organization: Not on file    Attends meetings of clubs or organizations: Not on file    Relationship status: Not on file  Other Topics Concern  . Not on file  Social History Narrative  . Not on file     Family History: The patient's family history includes Angina in his mother; Hypertension in his brother. ROS:   Please see the history of present illness.    All other systems reviewed and are negative.  EKGs/Labs/Other Studies Reviewed:    The following studies were reviewed today:   Recent Labs: 01/23/2018: ALT 50; BUN 12; Creatinine, Ser 1.02; Potassium 5.2; Sodium 139  Recent Lipid Panel    Component Value Date/Time   CHOL 161 01/23/2018 1024   TRIG 97 01/23/2018 1024   HDL 56 01/23/2018 1024   CHOLHDL 2.9 01/23/2018 1024   LDLCALC 86 01/23/2018 1024    Physical Exam:    VS:  BP (!) 148/76 (BP Location: Right Arm, Patient Position: Sitting, Cuff Size: Normal)   Pulse 65   Ht 6' (1.829 m)   Wt 229 lb (103.9 kg)   SpO2 98%   BMI 31.06 kg/m     Wt Readings from Last 3 Encounters:  04/07/18 229 lb (103.9 kg)  01/23/18 236 lb 1.9 oz (107.1 kg)  07/25/17 252 lb (114.3 kg)     GEN:  Well nourished, well developed in no acute distress HEENT: Normal NECK: No JVD; No carotid bruits LYMPHATICS: No lymphadenopathy CARDIAC: RRR, no murmurs, rubs, gallops RESPIRATORY:  Clear to  auscultation without rales, wheezing or rhonchi  ABDOMEN: Soft, non-tender, non-distended MUSCULOSKELETAL:  No edema; No deformity  SKIN: Warm and dry NEUROLOGIC:  Alert and oriented x 3 PSYCHIATRIC:  Normal affect    Signed, Shirlee More, MD  04/07/2018 8:30 AM    [place]

## 2018-04-07 NOTE — Patient Instructions (Addendum)
Medication Instructions:  Your physician recommends that you continue on your current medications as directed. Please refer to the Current Medication list given to you today.  Labwork: None  Testing/Procedures: None  Follow-Up: Your physician recommends that you schedule a follow-up appointment in: 3 months  Any Other Special Instructions Will Be Listed Below (If Applicable).     If you need a refill on your cardiac medications before your next appointment, please call your pharmacy.   [place], RN, BSN  Hypertension Hypertension is another name for high blood pressure. High blood pressure forces your heart to work harder to pump blood. This can cause problems over time. There are two numbers in a blood pressure reading. There is a top number (systolic) over a bottom number (diastolic). It is best to have a blood pressure below 120/80. Healthy choices can help lower your blood pressure. You may need medicine to help lower your blood pressure if:  Your blood pressure cannot be lowered with healthy choices.  Your blood pressure is higher than 130/80.  Follow these instructions at home: Eating and drinking  If directed, follow the DASH eating plan. This diet includes: ? Filling half of your plate at each meal with fruits and vegetables. ? Filling one quarter of your plate at each meal with whole grains. Whole grains include whole wheat pasta, brown rice, and whole grain bread. ? Eating or drinking low-fat dairy products, such as skim milk or low-fat yogurt. ? Filling one quarter of your plate at each meal with low-fat (lean) proteins. Low-fat proteins include fish, skinless chicken, eggs, beans, and tofu. ? Avoiding fatty meat, cured and processed meat, or chicken with skin. ? Avoiding premade or processed food.  Eat less than 1,500 mg of salt (sodium) a day.  Limit alcohol use to no more than 1 drink a day for nonpregnant women and 2 drinks a day for men.  One drink equals 12 oz of beer, 5 oz of wine, or 1 oz of hard liquor. Lifestyle  Work with your doctor to stay at a healthy weight or to lose weight. Ask your doctor what the best weight is for you.  Get at least 30 minutes of exercise that causes your heart to beat faster (aerobic exercise) most days of the week. This may include walking, swimming, or biking.  Get at least 30 minutes of exercise that strengthens your muscles (resistance exercise) at least 3 days a week. This may include lifting weights or pilates.  Do not use any products that contain nicotine or tobacco. This includes cigarettes and e-cigarettes. If you need help quitting, ask your doctor.  Check your blood pressure at home as told by your doctor.  Keep all follow-up visits as told by your doctor. This is important. Medicines  Take over-the-counter and prescription medicines only as told by your doctor. Follow directions carefully.  Do not skip doses of blood pressure medicine. The medicine does not work as well if you skip doses. Skipping doses also puts you at risk for problems.  Ask your doctor about side effects or reactions to medicines that you should watch for. Contact a doctor if:  You think you are having a reaction to the medicine you are taking.  You have headaches that keep coming back (recurring).  You feel dizzy.  You have swelling in your ankles.  You have trouble with your vision. Get help right away if:  You get a very bad headache.  You start  to feel confused.  You feel weak or numb.  You feel faint.  You get very bad pain in your: ? Chest. ? Belly (abdomen).  You throw up (vomit) more than once.  You have trouble breathing. Summary  Hypertension is another name for high blood pressure.  Making healthy choices can help lower blood pressure. If your blood pressure cannot be controlled with healthy choices, you may need to take medicine. This information is not intended to  replace advice given to you by your health care provider. Make sure you discuss any questions you have with your health care provider. Document Released: 02/09/2008 Document Revised: 07/21/2016 Document Reviewed: 07/21/2016 Elsevier Interactive Patient Education  [employer].

## 2018-08-14 ENCOUNTER — Telehealth: Payer: Self-pay

## 2018-08-14 MED ORDER — CLONIDINE HCL 0.1 MG PO TABS: 0.1000 mg | ORAL_TABLET | Freq: Every day | ORAL | 1 refills | Status: DC

## 2018-08-14 NOTE — Telephone Encounter (Signed)
Rx sent to pharmacy   

## 2018-09-06 HISTORY — PX: NECK SURGERY: SHX720

## 2018-09-15 ENCOUNTER — Other Ambulatory Visit: Payer: Self-pay | Admitting: Cardiology

## 2018-10-09 ENCOUNTER — Telehealth: Payer: Self-pay | Admitting: Cardiology

## 2018-10-09 MED ORDER — CLONIDINE HCL 0.1 MG PO TABS: 0.1000 mg | ORAL_TABLET | Freq: Two times a day (BID) | ORAL | 0 refills | Status: DC

## 2018-10-09 NOTE — Telephone Encounter (Signed)
Patient informed to increase clonidine 0.1 mg from once to twice daily. Refill sent to [facility] as requested. Advised patient to take his blood pressure once or twice daily at the same time every day and keep a log of these readings and bring to his follow up appointment. Informed patient to contact our office if his blood pressure does not improve. Patient is agreeable to plan and verbalized understanding. Patient will keep follow up appointment scheduled for 10/24/2018 as scheduled. No further questions.

## 2018-10-09 NOTE — Telephone Encounter (Signed)
Patient came by the office this am and states that his BP  has been running 200/100 thru 170/99 .. he is concerned and wants to see [facility] sooner than his appt, please call patient, he also took an extra Clonidine this am cause his head I busting.  He is hoping that we can possibly call him in something else.

## 2018-10-09 NOTE — Addendum Note (Signed)
Addended by: Austin Miles on: 10/09/2018 05:11 PM   Modules accepted: Orders

## 2018-10-09 NOTE — Telephone Encounter (Signed)
Patient reports that his blood pressure has been running high for the past couple of weeks (200/100, 198/92, 198/95, 170/99). Patient states that he has a nagging headache that will not go away. He is taking all of his medications as prescribed. Patient states that the minoxidil that was discontinued on 03/20/2018 due to side effects "was really what kept my blood pressure under control." Patient wants to know if any medication changes can be made before his follow up appointment on 10/24/2018 or if he should be seen in the office sooner. Will have Dr. Bettina Gavia advise.

## 2018-10-09 NOTE — Telephone Encounter (Signed)
Patient called to check on progress.

## 2018-10-09 NOTE — Telephone Encounter (Signed)
Increase clonidine to 0.1 mg BID

## 2018-10-13 ENCOUNTER — Other Ambulatory Visit: Payer: Self-pay | Admitting: Cardiology

## 2018-10-13 DIAGNOSIS — I10 Essential (primary) hypertension: Secondary | ICD-10-CM

## 2018-10-23 NOTE — Progress Notes (Signed)
Cardiology Office Note:    Date:  10/24/2018   ID:  Terry Stanley, DOB [birth year], MRN [id number]  PCP:  Street, [name], MD  Cardiologist:  Shirlee More, MD    Referring MD: [address], [name], *    ASSESSMENT:    1. Essential hypertension   2. Pure hypercholesterolemia   3. Chronic midline low back pain, unspecified whether sciatica present    PLAN:    In order of problems listed above:  1. His blood pressure is improved but now is in the range of 1 50-9 50 systolic and worsened with back pain.  I think the only other endeavor that be effective here is to address lifestyle asked to fully sodium restrict and consider a plant-based diet and I gave him Internet information and he said he was strongly considered.  His request to switch from one high intensity ARB to another that worked in his twin brother he will continue his other multiple antihypertensive medications and check potassium and renal function. 2. Stable continue statin check liver function lipid profile 3. Recently worsened blood pressure associated pain now improved and back to his baseline blood pressures.   Next appointment: 3 months   Medication Adjustments/Labs and Tests Ordered: Current medicines are reviewed at length with the patient today.  Concerns regarding medicines are outlined above.  Orders Placed This Encounter  Procedures  . Comp Met (CMET)  . Lipid Profile  . EKG 12-Lead   No orders of the defined types were placed in this encounter.   Chief Complaint  Patient presents with  . Follow-up  . Hypertension    History of Present Illness:    Terry Stanley is a 58 y.o. male with a hx of resistant hypertension and severe edema with minoxidil and gabapentin last seen 04/07/18. Compliance with diet, lifestyle and medications: Yes  Unfortunately despite a multitude of antihypertensive agents his blood pressure continues to run mostly in the 130-150/70-80 range.  He request to switch to  an ARB that worked better and his twin brother.  He has no edema shortness of breath chest pain palpitation or syncope we discussed I encouraged and he agreed to fully sodium restrict and consider a plant-based diet to try to optimize antihypertensive therapy Past Medical History:  Diagnosis Date  . Asthma 05/26/2017  . Essential hypertension 08/23/2016  . Hyperlipidemia 10/15/2011  . Low back pain 10/15/2011  . 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 PONV (postoperative nausea and vomiting) 05/26/2017  . Spinal stenosis of lumbar region 10/15/2011  . 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 Thoracic or lumbosacral neuritis or radiculitis 10/15/2011    Past Surgical History:  Procedure Laterality Date  . APPENDECTOMY    . CARPAL TUNNEL RELEASE    . NECK SURGERY  2017   fatty tumer removed  . thumb surgery Right     Current Medications: Current Meds  Medication Sig  . atorvastatin (LIPITOR) 40 MG tablet Take 40 mg by mouth daily.   Marland Kitchen BREO ELLIPTA 200-25 MCG/INH AEPB Inhale 1 puff into the lungs daily.   . cloNIDine (CATAPRES) 0.1 MG tablet Take 1 tablet (0.1 mg total) by mouth 2 (two) times daily.  . Coenzyme Q10 (CO Q 10) 100 MG CAPS Take 1 capsule by mouth daily.  . cyclobenzaprine (FLEXERIL) 10 MG tablet Take 10 mg by mouth daily as needed for muscle spasms.   . fluticasone (FLONASE) 50 MCG/ACT nasal spray Place 1 spray into both  nostrils daily as needed for allergies.   . hydrALAZINE (APRESOLINE) 50 MG tablet Take 50 mg by mouth 2 (two) times daily.   Marland Kitchen HYDROcodone-acetaminophen (NORCO) 10-325 MG tablet 1 tablet every 6 (six) hours as needed for moderate pain.   Marland Kitchen ibuprofen (ADVIL,MOTRIN) 200 MG tablet Take 600 mg by mouth every 6 (six) hours as needed.   . metoprolol tartrate (LOPRESSOR) 25 MG tablet TAKE ONE (1) TABLET BY MOUTH TWO (2) TIMES DAILY  . Omega-3 Fatty Acids (FISH OIL OMEGA-3 PO) Take 1 capsule by mouth daily.   Marland Kitchen omeprazole (PRILOSEC) 20 MG capsule  Take 20 mg by mouth daily.   . predniSONE (DELTASONE) 5 MG tablet Take 1 tablet by mouth daily.  . pregabalin (LYRICA) 50 MG capsule Take 50 mg by mouth 2 (two) times daily as needed.   Marland Kitchen PROAIR RESPICLICK 794 (90 Base) MCG/ACT AEPB Inhale 1 puff into the lungs every 6 (six) hours as needed (wheezing).   . tamsulosin (FLOMAX) 0.4 MG CAPS capsule Take 0.4 mg by mouth daily.  . traZODone (DESYREL) 50 MG tablet Take 25 mg by mouth at bedtime.   . vitamin C (ASCORBIC ACID) 500 MG tablet Take 500 mg by mouth daily.   . Zinc Acetate, Oral, (ZINC ACETATE PO) Take 1 tablet by mouth daily.  . [DISCONTINUED] telmisartan-hydrochlorothiazide (MICARDIS HCT) 80-12.5 MG tablet TAKE ONE (1) TABLET BY MOUTH ONCE DAILY     Allergies:   Bee venom   Social History   Socioeconomic History  . Marital status: Married    Spouse name: Not on file  . Number of children: Not on file  . Years of education: Not on file  . Highest education level: Not on file  Occupational History  . Not on file  Social Needs  . Financial resource strain: Not on file  . Food insecurity:    Worry: Not on file    Inability: Not on file  . Transportation needs:    Medical: Not on file    Non-medical: Not on file  Tobacco Use  . Smoking status: Former Research scientist (life sciences)  . Smokeless tobacco: Never Used  Substance and Sexual Activity  . Alcohol use: Yes    Alcohol/week: 12.0 standard drinks    Types: 12 Cans of beer per week  . Drug use: No  . Sexual activity: Not on file  Lifestyle  . Physical activity:    Days per week: Not on file    Minutes per session: Not on file  . Stress: Not on file  Relationships  . Social connections:    Talks on phone: Not on file    Gets together: Not on file    Attends religious service: Not on file    Active member of club or organization: Not on file    Attends meetings of clubs or organizations: Not on file    Relationship status: Not on file  Other Topics Concern  . Not on file  Social  History Narrative  . Not on file     Family History: The patient's family history includes Angina in his mother; Hypertension in his brother. ROS:   Please see the history of present illness.    All other systems reviewed and are negative.  EKGs/Labs/Other Studies Reviewed:    The following studies were reviewed today:   01/23/2018: ALT 50; BUN 12; Creatinine, Ser 1.02; Potassium 5.2; Sodium 139  Recent Lipid Panel    Component Value Date/Time   CHOL 161 01/23/2018 1024  TRIG 97 01/23/2018 1024   HDL 56 01/23/2018 1024   CHOLHDL 2.9 01/23/2018 1024   LDLCALC 86 01/23/2018 1024    Physical Exam:    VS:  BP (!) 152/88 (BP Location: Right Arm, Patient Position: Sitting, Cuff Size: Large)   Pulse (!) 52   Ht 6' (1.829 m)   Wt 233 lb (105.7 kg)   SpO2 98%   BMI 31.60 kg/m     Wt Readings from Last 3 Encounters:  10/24/18 233 lb (105.7 kg)  04/07/18 229 lb (103.9 kg)  01/23/18 236 lb 1.9 oz (107.1 kg)     GEN:  Well nourished, well developed in no acute distress HEENT: Normal NECK: No JVD; No carotid bruits LYMPHATICS: No lymphadenopathy CARDIAC: RRR, no murmurs, rubs, gallops RESPIRATORY:  Clear to auscultation without rales, wheezing or rhonchi  ABDOMEN: Soft, non-tender, non-distended MUSCULOSKELETAL:  No edema; No deformity  SKIN: Warm and dry NEUROLOGIC:  Alert and oriented x 3 PSYCHIATRIC:  Normal affect    Signed, Shirlee More, MD  10/24/2018 11:07 AM    [place]

## 2018-10-24 ENCOUNTER — Ambulatory Visit: Payer: Managed Care, Other (non HMO) | Admitting: Cardiology

## 2018-10-24 ENCOUNTER — Encounter: Payer: Self-pay | Admitting: Cardiology

## 2018-10-24 VITALS — BP 152/88 | HR 52 | Ht 72.0 in | Wt 233.0 lb

## 2018-10-24 DIAGNOSIS — I10 Essential (primary) hypertension: Secondary | ICD-10-CM

## 2018-10-24 DIAGNOSIS — E7800 Pure hypercholesterolemia, unspecified: Secondary | ICD-10-CM

## 2018-10-24 DIAGNOSIS — G8929 Other chronic pain: Secondary | ICD-10-CM | POA: Diagnosis not present

## 2018-10-24 DIAGNOSIS — M545 Low back pain, unspecified: Secondary | ICD-10-CM

## 2018-10-24 MED ORDER — OLMESARTAN MEDOXOMIL-HCTZ 40-12.5 MG PO TABS: 1.0000 | ORAL_TABLET | Freq: Every day | ORAL | 3 refills | Status: DC

## 2018-10-24 NOTE — Patient Instructions (Addendum)
Medication Instructions:  Your physician has recommended you make the following change in your medication:   DISCONTINUE taking telmisartan-HCTZ  Daily START olmesartan-HCTZ 40 mg-12.5 (1 tablet) daily  If you need a refill on your cardiac medications before your next appointment, please call your pharmacy.   Lab work: CMP and Lipid panel today  If you have labs (blood work) drawn today and your tests are completely normal, you will receive your results only by: Marland Kitchen MyChart Message (if you have MyChart) OR . A paper copy in the mail If you have any lab test that is abnormal or we need to change your treatment, we will call you to review the results.  Testing/Procedures: An EKG was performed today.  Follow-Up: At [facility], you and your health needs are our priority.  As part of our continuing mission to provide you with exceptional heart care, we have created designated Provider Care Teams.  These Care Teams include your primary Cardiologist (physician) and Advanced Practice Providers (APPs -  Physician Assistants and Nurse Practitioners) who all work together to provide you with the care you need, when you need it. You will need a follow up appointment in 3 months.    Hydrochlorothiazide, HCTZ; Olmesartan Tablets What is this medicine? HYDROCHLOROTHIAZIDE; OLMESARTAN (hye droe klor oh THYE a zide; all mi SAR tan) is a combination of a diuretic and a drug that relaxes blood vessels. It is used to treat high blood pressure. This medicine may be used for other purposes; ask your health care provider or pharmacist if you have questions. COMMON BRAND NAME(S): Benicar HCT What should I tell my health care provider before I take this medicine? They need to know if you have any of these conditions: -asthma -decreased urine -if you are on a special diet, such as a low salt diet -immune system problems, like lupus -kidney disease -liver disease -an unusual reaction to  hydrochlorothiazide, olmesartan, sulfa drugs, other medicines, foods, dyes, or preservatives -pregnant or trying to get pregnant -breast-feeding How should I use this medicine? Take this medicine by mouth with a glass of water. Follow the directions on the prescription label. You can take it with or without food. If it upsets your stomach, take it with food. Take your medicine at regular intervals. Do not take it more often than directed. Do not stop taking except on your doctor's advice. Talk to your pediatrician regarding the use of this medicine in children. Special care may be needed. Overdosage: If you think you have taken too much of this medicine contact a poison control center or emergency room at once. NOTE: This medicine is only for you. Do not share this medicine with others. What if I miss a dose? If you miss a dose, take it as soon as you can. If it is almost time for your next dose, take only that dose. Do not take double or extra doses. What may interact with this medicine? -barbiturates like phenobarbital -corticosteroids like prednisone -diuretics, especially triamterene, spironolactone or amiloride -lithium -medicines for diabetes -medicines for high blood pressure -NSAIDs like ibuprofen -potassium salts or potassium supplements -prescription pain medicines -skeletal muscle relaxants like tubocurarine -some cholesterol lowering medications like cholestyramine or colestipol This list may not describe all possible interactions. Give your health care provider a list of all the medicines, herbs, non-prescription drugs, or dietary supplements you use. Also tell them if you smoke, drink alcohol, or use illegal drugs. Some items may interact with your medicine. What should I watch  for while using this medicine? Check your blood pressure regularly while you are taking this medicine. Ask your doctor or health care professional what your blood pressure should be and when you should  contact him or her. When you check your blood pressure, write down the measurements to show your doctor or health care professional. If you are taking this medicine for a long time, you must visit your health care professional for regular checks on your progress. Make sure you schedule appointments on a regular basis. You must not get dehydrated. Ask your doctor or health care professional how much fluid you need to drink a day. Check with him or her if you get an attack of severe diarrhea, nausea and vomiting, or if you sweat a lot. The loss of too much body fluid can make it dangerous for you to take this medicine. Women should inform their doctor if they wish to become pregnant or think they might be pregnant. There is a potential for serious side effects to an unborn child, particularly in the second or third trimester. Talk to your health care professional or pharmacist for more information. You may get drowsy or dizzy. Do not drive, use machinery, or do anything that needs mental alertness until you know how this drug affects you. Do not stand or sit up quickly, especially if you are an older patient. This reduces the risk of dizzy or fainting spells. Alcohol can make you more drowsy and dizzy. Avoid alcoholic drinks. This medicine may affect your blood sugar level. If you have diabetes, check with your doctor or health care professional before changing the dose of your diabetic medicine. Avoid salt substitutes unless you are told otherwise by your doctor or health care professional. Do not treat yourself for coughs, colds, or pain while you are taking this medicine without asking your doctor or health care professional for advice. Some ingredients may increase your blood pressure. This medicine can make you more sensitive to the sun. Keep out of the sun. If you cannot avoid being in the sun, wear protective clothing and use sunscreen. Do not use sun lamps or tanning beds/booths. What side effects may  I notice from receiving this medicine? Side effects that you should report to your doctor or health care professional as soon as possible: -allergic reactions like skin rash, itching or hives, swelling of the face, lips, or tongue -breathing problems -changes in vision -dark urine -diarrhea -eye pain -fast or irregular heart beat, palpitations, or chest pain -feeling faint or lightheaded -muscle cramps -persistent dry cough -redness, blistering, peeling or loosening of the skin, including inside the mouth -stomach pain -trouble passing urine or change in the amount of urine -unusual bleeding or bruising -vomiting -weight loss -worsened gout pain -yellowing of the eyes or skin Side effects that usually do not require medical attention (report to your doctor or health care professional if they continue or are bothersome): -change in sex drive or performance -headache -nausea This list may not describe all possible side effects. Call your doctor for medical advice about side effects. You may report side effects to FDA at 1-800-FDA-1088. Where should I keep my medicine? Keep out of the reach of children. Store your medicine at room temperature between 20 and 25 degrees C (68 and 77 degrees F). Throw away any unused medicine after the expiration date. NOTE: This sheet is a summary. It may not cover all possible information. If you have questions about this medicine, talk to your doctor, pharmacist,  or health care provider.  2019 Elsevier/Gold Standard (2012-03-08 13:53:44)

## 2018-10-24 NOTE — Addendum Note (Signed)
Addended by: Beckey Rutter on: 10/24/2018 11:14 AM   Modules accepted: Orders

## 2018-10-25 LAB — COMPREHENSIVE METABOLIC PANEL
A/G RATIO: 1.9 (ref 1.2–2.2)
ALT: 17 IU/L (ref 0–44)
AST: 14 IU/L (ref 0–40)
Albumin: 4.8 g/dL (ref 3.8–4.9)
Alkaline Phosphatase: 66 IU/L (ref 39–117)
BILIRUBIN TOTAL: 0.6 mg/dL (ref 0.0–1.2)
BUN/Creatinine Ratio: 14 (ref 9–20)
BUN: 14 mg/dL (ref 6–24)
CALCIUM: 9.6 mg/dL (ref 8.7–10.2)
CO2: 23 mmol/L (ref 20–29)
Chloride: 94 mmol/L — ABNORMAL LOW (ref 96–106)
Creatinine, Ser: 1.02 mg/dL (ref 0.76–1.27)
GFR, EST AFRICAN AMERICAN: 94 mL/min/{1.73_m2} (ref >59)
GFR, EST NON AFRICAN AMERICAN: 81 mL/min/{1.73_m2} (ref >59)
Globulin, Total: 2.5 g/dL (ref 1.5–4.5)
Glucose: 93 mg/dL (ref 65–99)
POTASSIUM: 4.1 mmol/L (ref 3.5–5.2)
Sodium: 136 mmol/L (ref 134–144)
TOTAL PROTEIN: 7.3 g/dL (ref 6.0–8.5)

## 2018-10-25 LAB — LIPID PANEL
CHOL/HDL RATIO: 3.4 ratio (ref 0.0–5.0)
Cholesterol, Total: 193 mg/dL (ref 100–199)
HDL: 56 mg/dL (ref >39)
LDL Calculated: 105 mg/dL — ABNORMAL HIGH (ref 0–99)
Triglycerides: 161 mg/dL — ABNORMAL HIGH (ref 0–149)
VLDL Cholesterol Cal: 32 mg/dL (ref 5–40)

## 2018-11-15 ENCOUNTER — Other Ambulatory Visit: Payer: Self-pay | Admitting: Cardiology

## 2018-11-15 ENCOUNTER — Other Ambulatory Visit: Payer: Self-pay

## 2018-12-15 ENCOUNTER — Other Ambulatory Visit: Payer: Self-pay | Admitting: Cardiology

## 2019-01-26 ENCOUNTER — Telehealth: Payer: Self-pay | Admitting: Cardiology

## 2019-01-26 NOTE — Telephone Encounter (Signed)
Virtual Visit Pre-Appointment Phone Call  "(Name), I am calling you today to discuss your upcoming appointment. We are currently trying to limit exposure to the virus that causes COVID-19 by seeing patients at home rather than in the office."  1. "What is the BEST phone number to call the day of the visit?" - include this in appointment notes  2. Do you have or have access to (through a family member/friend) a smartphone with video capability that we can use for your visit?" a. If yes - list this number in appt notes as cell (if different from BEST phone #) and list the appointment type as a VIDEO visit in appointment notes b. If no - list the appointment type as a PHONE visit in appointment notes  3. Confirm consent - "In the setting of the current Covid19 crisis, you are scheduled for a (phone or video) visit with your provider on (date) at (time).  Just as we do with many in-office visits, in order for you to participate in this visit, we must obtain consent.  If you'd like, I can send this to your mychart (if signed up) or email for you to review.  Otherwise, I can obtain your verbal consent now.  All virtual visits are billed to your insurance company just like a normal visit would be.  By agreeing to a virtual visit, we'd like you to understand that the technology does not allow for your provider to perform an examination, and thus may limit your provider's ability to fully assess your condition. If your provider identifies any concerns that need to be evaluated in person, we will make arrangements to do so.  Finally, though the technology is pretty good, we cannot assure that it will always work on either your or our end, and in the setting of a video visit, we may have to convert it to a phone-only visit.  In either situation, we cannot ensure that we have a secure connection.  Are you willing to proceed?" STAFF: Did the patient verbally acknowledge consent to telehealth visit? Document  YES/NO here: Yes pt agrees  4. Advise patient to be prepared - "Two hours prior to your appointment, go ahead and check your blood pressure, pulse, oxygen saturation, and your weight (if you have the equipment to check those) and write them all down. When your visit starts, your provider will ask you for this information. If you have an Apple Watch or Kardia device, please plan to have heart rate information ready on the day of your appointment. Please have a pen and paper handy nearby the day of the visit as well."  5. Give patient instructions for MyChart download to smartphone OR Doximity/Doxy.me as below if video visit (depending on what platform provider is using)  6. Inform patient they will receive a phone call 15 minutes prior to their appointment time (may be from unknown caller ID) so they should be prepared to answer    TELEPHONE CALL NOTE  Terry Stanley has been deemed a candidate for a follow-up tele-health visit to limit community exposure during the Covid-19 pandemic. I spoke with the patient via phone to ensure availability of phone/video source, confirm preferred email & phone number, and discuss instructions and expectations.  I reminded Terry Stanley to be prepared with any vital sign and/or heart rhythm information that could potentially be obtained via home monitoring, at the time of his visit. I reminded Terry Stanley to expect a phone call prior to his  visit.  Terry Stanley 01/26/2019 11:00 AM   INSTRUCTIONS FOR DOWNLOADING THE MYCHART APP TO SMARTPHONE  - The patient must first make sure to have activated MyChart and know their login information - If Apple, go to [employer] and type in MyChart in the search bar and download the app. If Android, ask patient to go to [employer] and type in [place] in the search bar and download the app. The app is free but as with any other app downloads, their phone may require them to verify saved payment information or  Apple/Android password.  - The patient will need to then log into the app with their MyChart username and password, and select [facility] as their healthcare provider to link the account. When it is time for your visit, go to the MyChart app, find appointments, and click Begin Video Visit. Be sure to Select Allow for your device to access the Microphone and Camera for your visit. You will then be connected, and your provider will be with you shortly.  **If they have any issues connecting, or need assistance please contact MyChart service desk [phone])**  **If using a computer, in order to ensure the best quality for their visit they will need to use either of the following Internet Browsers: [employer], or Google Chrome**  IF USING DOXIMITY or DOXY.ME - The patient will receive a link just prior to their visit by text.     FULL LENGTH CONSENT FOR TELE-HEALTH VISIT   I hereby voluntarily request, consent and authorize [facility] and its employed or contracted physicians, physician assistants, nurse practitioners or other licensed health care professionals (the Practitioner), to provide me with telemedicine health care services (the Services") as deemed necessary by the treating Practitioner. I acknowledge and consent to receive the Services by the Practitioner via telemedicine. I understand that the telemedicine visit will involve communicating with the Practitioner through live audiovisual communication technology and the disclosure of certain medical information by electronic transmission. I acknowledge that I have been given the opportunity to request an in-person assessment or other available alternative prior to the telemedicine visit and am voluntarily participating in the telemedicine visit.  I understand that I have the right to withhold or withdraw my consent to the use of telemedicine in the course of my care at any time, without affecting my right to future care  or treatment, and that the Practitioner or I may terminate the telemedicine visit at any time. I understand that I have the right to inspect all information obtained and/or recorded in the course of the telemedicine visit and may receive copies of available information for a reasonable fee.  I understand that some of the potential risks of receiving the Services via telemedicine include:   Delay or interruption in medical evaluation due to technological equipment failure or disruption;  Information transmitted may not be sufficient (e.g. poor resolution of images) to allow for appropriate medical decision making by the Practitioner; and/or   In rare instances, security protocols could fail, causing a breach of personal health information.  Furthermore, I acknowledge that it is my responsibility to provide information about my medical history, conditions and care that is complete and accurate to the best of my ability. I acknowledge that Practitioner's advice, recommendations, and/or decision may be based on factors not within their control, such as incomplete or inaccurate data provided by me or distortions of diagnostic images or specimens that may result from electronic transmissions. I understand  that the practice of medicine is not an exact science and that Practitioner makes no warranties or guarantees regarding treatment outcomes. I acknowledge that I will receive a copy of this consent concurrently upon execution via email to the email address I last provided but may also request a printed copy by calling the office of [facility].    I understand that my insurance will be billed for this visit.   I have read or had this consent read to me.  I understand the contents of this consent, which adequately explains the benefits and risks of the Services being provided via telemedicine.   I have been provided ample opportunity to ask questions regarding this consent and the Services and have had  my questions answered to my satisfaction.  I give my informed consent for the services to be provided through the use of telemedicine in my medical care  By participating in this telemedicine visit I agree to the above.

## 2019-01-30 ENCOUNTER — Encounter: Payer: Self-pay | Admitting: Cardiology

## 2019-01-30 ENCOUNTER — Other Ambulatory Visit: Payer: Self-pay

## 2019-01-30 ENCOUNTER — Telehealth (INDEPENDENT_AMBULATORY_CARE_PROVIDER_SITE_OTHER): Payer: Managed Care, Other (non HMO) | Admitting: Cardiology

## 2019-01-30 VITALS — BP 141/84 | HR 62 | Ht 72.0 in | Wt 233.0 lb

## 2019-01-30 DIAGNOSIS — I10 Essential (primary) hypertension: Secondary | ICD-10-CM | POA: Diagnosis not present

## 2019-01-30 DIAGNOSIS — E7800 Pure hypercholesterolemia, unspecified: Secondary | ICD-10-CM

## 2019-01-30 NOTE — Progress Notes (Signed)
Virtual Visit via Video Note   This visit type was conducted due to national recommendations for restrictions regarding the COVID-19 Pandemic (e.g. social distancing) in an effort to limit this patient's exposure and mitigate transmission in our community.  Due to his co-morbid illnesses, this patient is at least at moderate risk for complications without adequate follow up.  This format is felt to be most appropriate for this patient at this time.  All issues noted in this document were discussed and addressed.  A limited physical exam was performed with this format.  Please refer to the patient's chart for his consent to telehealth for [facility].   Date:  01/30/2019   ID:  Terry Stanley, DOB 1961/05/31, MRN [id number]  Patient Location: Home Provider Location: Office  PCP:  Street, [name], MD  Cardiologist:  Shirlee More, MD  Electrophysiologist:  None   Evaluation Performed:  Follow-Up Visit  Chief Complaint: Hypertension  History of Present Illness:    Terry Stanley is a 58 y.o. male with resistant hypertension last seen 10/24/18. 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  He is not having chest pain.  Associated with pain he is nauseous.  Home blood pressure has been at target compliant with medications.  We will plan to recheck CMP and a lipid profile as he also has hyperlipidemia and I will see him in the office in 3 months.  The patient does not have symptoms concerning for COVID-19 infection (fever, chills, cough, or new shortness of breath).    Past Medical History:  Diagnosis Date  . Asthma 05/26/2017  . Essential hypertension 08/23/2016  . Hyperlipidemia 10/15/2011  . Low back pain 10/15/2011  . 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 PONV (postoperative nausea and vomiting) 05/26/2017  . Spinal stenosis of  lumbar region 10/15/2011  . 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 Thoracic or lumbosacral neuritis or radiculitis 10/15/2011   Past Surgical History:  Procedure Laterality Date  . APPENDECTOMY    . CARPAL TUNNEL RELEASE    . NECK SURGERY  2017   fatty tumer removed  . thumb surgery Right      No outpatient medications have been marked as taking for the 01/30/19 encounter (Appointment) with Richardo Priest, MD.     Allergies:   Bee venom   Social History   Tobacco Use  . Smoking status: Former Research scientist (life sciences)  . Smokeless tobacco: Never Used  Substance Use Topics  . Alcohol use: Yes    Alcohol/week: 12.0 standard drinks    Types: 12 Cans of beer per week  . Drug use: No     Family Hx: The patient's family history includes Angina in his mother; Hypertension in his brother.  ROS:   Please see the history of present illness.     All other systems reviewed and are negative.   Prior CV studies:   The following studies were reviewed today:    Labs/Other Tests and Data Reviewed:    EKG:  No ECG reviewed.  Recent Labs: 10/24/2018: ALT 17; BUN 14; Creatinine, Ser 1.02; Potassium 4.1; Sodium 136   Recent Lipid Panel Lab Results  Component Value Date/Time   CHOL 193 10/24/2018 10:57 AM   TRIG 161 (H) 10/24/2018 10:57 AM   HDL 56 10/24/2018 10:57 AM   CHOLHDL  3.4 10/24/2018 10:57 AM   LDLCALC 105 (H) 10/24/2018 10:57 AM    Wt Readings from Last 3 Encounters:  10/24/18 233 lb (105.7 kg)  04/07/18 229 lb (103.9 kg)  01/23/18 236 lb 1.9 oz (107.1 kg)     Objective:    Vital Signs:  There were no vitals taken for this visit.   VITAL SIGNS:  reviewed GEN:  no acute distress EYES:  sclerae anicteric, EOMI - Extraocular Movements Intact RESPIRATORY:  normal respiratory effort, symmetric expansion CARDIOVASCULAR:  no peripheral edema SKIN:  no rash, lesions or ulcers. MUSCULOSKELETAL:  no obvious deformities. NEURO:  alert and oriented x 3, no obvious focal deficit PSYCH:   normal affect  ASSESSMENT & PLAN:    1. Resistant hypertension improved he is on a 5 drug regimen her goal is to have a systolic in the range of 1 40-1 50 continue his current treatment recheck lipid liver function renal function potassium along with lipid profile 2. Hyperlipidemia stable continue a statin may require a second agent recheck his LDL cholesterol  COVID-19 Education: The signs and symptoms of COVID-19 were discussed with the patient and how to seek care for testing (follow up with PCP or arrange E-visit).  The importance of social distancing was discussed today.  Time:   Today, I have spent 15 minutes with the patient with telehealth technology discussing the above problems.     Medication Adjustments/Labs and Tests Ordered: Current medicines are reviewed at length with the patient today.  Concerns regarding medicines are outlined above.   Tests Ordered: No orders of the defined types were placed in this encounter.   Medication Changes: No orders of the defined types were placed in this encounter.   Disposition:  Follow up in 3 month(s)  Signed, Shirlee More, MD  01/30/2019 7:42 AM    [facility]

## 2019-01-30 NOTE — Patient Instructions (Addendum)
Medication Instructions:  Your physician recommends that you continue on your current medications as directed. Please refer to the Current Medication list given to you today.  If you need a refill on your cardiac medications before your next appointment, please call your pharmacy.   Lab work: Your physician recommends that you return for lab work next week: CMP, lipid profile. Please go to our [place] office for lab work, no appointment needed. Please fast beforehand.   If you have labs (blood work) drawn today and your tests are completely normal, you will receive your results only by: Marland Kitchen MyChart Message (if you have MyChart) OR . A paper copy in the mail If you have any lab test that is abnormal or we need to change your treatment, we will call you to review the results.  Testing/Procedures: None  Follow-Up: At [facility], you and your health needs are our priority.  As part of our continuing mission to provide you with exceptional heart care, we have created designated Provider Care Teams.  These Care Teams include your primary Cardiologist (physician) and Advanced Practice Providers (APPs -  Physician Assistants and Nurse Practitioners) who all work together to provide you with the care you need, when you need it. You will need a follow up appointment in 3 months: Wednesday, 05/02/2019, at 9:00 am in the [place] office.

## 2019-01-30 NOTE — Addendum Note (Signed)
Addended by: Austin Miles on: 01/30/2019 10:38 AM   Modules accepted: Orders

## 2019-02-14 LAB — COMPREHENSIVE METABOLIC PANEL
ALT: 20 IU/L (ref 0–44)
AST: 19 IU/L (ref 0–40)
Albumin/Globulin Ratio: 2.3 — ABNORMAL HIGH (ref 1.2–2.2)
Albumin: 4.8 g/dL (ref 3.8–4.9)
Alkaline Phosphatase: 78 IU/L (ref 39–117)
BUN/Creatinine Ratio: 12 (ref 9–20)
BUN: 14 mg/dL (ref 6–24)
Bilirubin Total: 0.6 mg/dL (ref 0.0–1.2)
CO2: 23 mmol/L (ref 20–29)
Calcium: 9.8 mg/dL (ref 8.7–10.2)
Chloride: 103 mmol/L (ref 96–106)
Creatinine, Ser: 1.21 mg/dL (ref 0.76–1.27)
GFR calc Af Amer: 76 mL/min/{1.73_m2} (ref >59)
GFR calc non Af Amer: 66 mL/min/{1.73_m2} (ref >59)
Globulin, Total: 2.1 g/dL (ref 1.5–4.5)
Glucose: 103 mg/dL — ABNORMAL HIGH (ref 65–99)
Potassium: 4.9 mmol/L (ref 3.5–5.2)
Sodium: 140 mmol/L (ref 134–144)
Total Protein: 6.9 g/dL (ref 6.0–8.5)

## 2019-02-14 LAB — LIPID PANEL
Chol/HDL Ratio: 3.7 ratio (ref 0.0–5.0)
Cholesterol, Total: 175 mg/dL (ref 100–199)
HDL: 47 mg/dL (ref >39)
LDL Calculated: 89 mg/dL (ref 0–99)
Triglycerides: 197 mg/dL — ABNORMAL HIGH (ref 0–149)
VLDL Cholesterol Cal: 39 mg/dL (ref 5–40)

## 2019-02-19 ENCOUNTER — Other Ambulatory Visit: Payer: Self-pay | Admitting: Cardiology

## 2019-03-20 ENCOUNTER — Other Ambulatory Visit: Payer: Self-pay | Admitting: Cardiology

## 2019-03-23 NOTE — Telephone Encounter (Signed)
Clonidine refill sent

## 2019-05-02 ENCOUNTER — Ambulatory Visit: Payer: Managed Care, Other (non HMO) | Admitting: Cardiology

## 2019-05-22 ENCOUNTER — Other Ambulatory Visit: Payer: Self-pay | Admitting: Cardiology

## 2019-05-22 DIAGNOSIS — I10 Essential (primary) hypertension: Secondary | ICD-10-CM

## 2019-06-12 DIAGNOSIS — M4722 Other spondylosis with radiculopathy, cervical region: Secondary | ICD-10-CM

## 2019-06-12 HISTORY — DX: Other spondylosis with radiculopathy, cervical region: M47.22

## 2019-06-20 ENCOUNTER — Other Ambulatory Visit: Payer: Self-pay | Admitting: Cardiology

## 2019-07-01 NOTE — Progress Notes (Signed)
Cardiology Office Note:    Date:  07/03/2019   ID:  Terry Stanley, DOB Aug 10, 1961, MRN [id number]  PCP:  Street, [name], MD  Cardiologist:  Shirlee More, MD    Referring MD: [address] Terry Stanley, *    ASSESSMENT:    1. Resistant hypertension   2. Pure hypercholesterolemia   3. Cardiac risk counseling    PLAN:    In order of problems listed above:  1. Improved he has very resistant hypertension requires a multidrug modality BP at target repeat by me 126/76 I will continue his current regimen and see me in 3 months. 2. Stable continue statin.  His last lipid profile showed an LDL of 89 cholesterol 175 HDL 47 in June continue his current high intensity statin 3. Increased cardiovascular risk with shared decision making undergo cardiac CT calcium score if high risk would need an ischemia evaluation   Next appointment: 3 Months   Medication Adjustments/Labs and Tests Ordered: Current medicines are reviewed at length with the patient today.  Concerns regarding medicines are outlined above.  No orders of the defined types were placed in this encounter.  No orders of the defined types were placed in this encounter.   Chief Complaint  Patient presents with  . Follow-up  . Hypertension    History of Present Illness:    Terry Stanley is a 58 y.o. male with a hx of resistant hypertension and hyperlipidemia last seen 01/30/2019. Compliance with diet, lifestyle and medications: Yes  He is struggling recovering from surgical spine surgery however he is steadily improving he had preoperative labs 06/05/2019 hemoglobin 13.4 potassium 4.2 creatinine 0.97 potassium was normal.  Is had no angina dyspnea or palpitation.  A close friend of ours recent had a myocardial infarction he is worried about his cardiovascular risk and after discussion of modalities wants to undergo cardiac CTA for risk assessment.  If he had a high calcium score he benefit from ischemia evaluation he  understands he will pay out-of-pocket will be scheduled at [facility] Past Medical History:  Diagnosis Date  . Asthma 05/26/2017  . Essential hypertension 08/23/2016  . Hyperlipidemia 10/15/2011  . Low back pain 10/15/2011  . 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 PONV (postoperative nausea and vomiting) 05/26/2017  . Spinal stenosis of lumbar region 10/15/2011  . 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 Thoracic or lumbosacral neuritis or radiculitis 10/15/2011    Past Surgical History:  Procedure Laterality Date  . APPENDECTOMY    . CARPAL TUNNEL RELEASE    . NECK SURGERY  2017   fatty tumer removed  . thumb surgery Right     Current Medications: Current Meds  Medication Sig  . atorvastatin (LIPITOR) 40 MG tablet Take 40 mg by mouth daily.   Marland Kitchen BREO ELLIPTA 200-25 MCG/INH AEPB Inhale 1 puff into the lungs daily.   . cloNIDine (CATAPRES) 0.1 MG tablet TAKE ONE TABLET TWICE DAILY  . fluticasone (FLONASE) 50 MCG/ACT nasal spray Place 1 spray into both nostrils daily as needed for allergies.   . hydrALAZINE (APRESOLINE) 50 MG tablet Take 50 mg by mouth 2 (two) times daily.   . hydrochlorothiazide (HYDRODIURIL) 12.5 MG tablet TAKE ONE (1) TABLET BY MOUTH ONCE DAILY  . HYDROcodone-acetaminophen (NORCO) 10-325 MG tablet 1 tablet every 6 (six) hours as needed for moderate pain.   . methocarbamol (ROBAXIN) 750 MG tablet Take 750 mg by mouth every 8 (eight) hours as needed for muscle spasms.  Marland Kitchen  metoprolol tartrate (LOPRESSOR) 25 MG tablet TAKE ONE (1) TABLET BY MOUTH TWO (2) TIMES DAILY  . olmesartan (BENICAR) 40 MG tablet TAKE ONE (1) TABLET BY MOUTH ONCE DAILY  . Omega-3 Fatty Acids (FISH OIL OMEGA-3 PO) Take 1 capsule by mouth daily.   Marland Kitchen omeprazole (PRILOSEC) 20 MG capsule Take 20 mg by mouth daily.   . pregabalin (LYRICA) 50 MG capsule Take 50 mg by mouth 2 (two) times daily as needed.   . tamsulosin (FLOMAX) 0.4 MG CAPS capsule Take 0.4 mg by mouth daily.  .  traZODone (DESYREL) 50 MG tablet Take 25 mg by mouth at bedtime.   . vitamin C (ASCORBIC ACID) 500 MG tablet Take 500 mg by mouth daily.      Allergies:   Bee venom and Carvedilol   Social History   Socioeconomic History  . Marital status: Married    Spouse name: Not on file  . Number of children: Not on file  . Years of education: Not on file  . Highest education level: Not on file  Occupational History  . Not on file  Social Needs  . Financial resource strain: Not on file  . Food insecurity    Worry: Not on file    Inability: Not on file  . Transportation needs    Medical: Not on file    Non-medical: Not on file  Tobacco Use  . Smoking status: Former Research scientist (life sciences)  . Smokeless tobacco: Never Used  Substance and Sexual Activity  . Alcohol use: Yes    Alcohol/week: 12.0 standard drinks    Types: 12 Cans of beer per week  . Drug use: No  . Sexual activity: Not on file  Lifestyle  . Physical activity    Days per week: Not on file    Minutes per session: Not on file  . Stress: Not on file  Relationships  . Social Herbalist on phone: Not on file    Gets together: Not on file    Attends religious service: Not on file    Active member of club or organization: Not on file    Attends meetings of clubs or organizations: Not on file    Relationship status: Not on file  Other Topics Concern  . Not on file  Social History Narrative  . Not on file     Family History: The patient's family history includes Angina in his mother; Hypertension in his brother. ROS:   Please see the history of present illness.    All other systems reviewed and are negative.  EKGs/Labs/Other Studies Reviewed:    The following studies were reviewed today  Recent Labs: 02/13/2019: ALT 20; BUN 14; Creatinine, Ser 1.21; Potassium 4.9; Sodium 140  Recent Lipid Panel    Component Value Date/Time   CHOL 175 02/13/2019 1051   TRIG 197 (H) 02/13/2019 1051   HDL 47 02/13/2019 1051   CHOLHDL  3.7 02/13/2019 1051   LDLCALC 89 02/13/2019 1051    Physical Exam:    VS:  BP 110/60   Pulse 68   Ht 6' (1.829 m)   Wt 242 lb 12.8 oz (110.1 kg)   SpO2 98%   BMI 32.93 kg/m     Wt Readings from Last 3 Encounters:  07/03/19 242 lb 12.8 oz (110.1 kg)  01/30/19 233 lb (105.7 kg)  10/24/18 233 lb (105.7 kg)     GEN:  Well nourished, well developed in no acute distress HEENT: Normal NECK: No  JVD; No carotid bruits LYMPHATICS: No lymphadenopathy CARDIAC: RRR, no murmurs, rubs, gallops RESPIRATORY:  Clear to auscultation without rales, wheezing or rhonchi  ABDOMEN: Soft, non-tender, non-distended MUSCULOSKELETAL:  No edema; No deformity  SKIN: Warm and dry NEUROLOGIC:  Alert and oriented x 3 PSYCHIATRIC:  Normal affect    Signed, Shirlee More, MD  07/03/2019 9:37 AM    [place]

## 2019-07-03 ENCOUNTER — Other Ambulatory Visit: Payer: Self-pay

## 2019-07-03 ENCOUNTER — Ambulatory Visit: Payer: Managed Care, Other (non HMO) | Admitting: Cardiology

## 2019-07-03 ENCOUNTER — Encounter: Payer: Self-pay | Admitting: Cardiology

## 2019-07-03 VITALS — BP 110/60 | HR 68 | Ht 72.0 in | Wt 242.8 lb

## 2019-07-03 DIAGNOSIS — I10 Essential (primary) hypertension: Secondary | ICD-10-CM

## 2019-07-03 DIAGNOSIS — Z7189 Other specified counseling: Secondary | ICD-10-CM

## 2019-07-03 DIAGNOSIS — E7800 Pure hypercholesterolemia, unspecified: Secondary | ICD-10-CM

## 2019-07-03 NOTE — Patient Instructions (Signed)
Medication Instructions:  Your physician recommends that you continue on your current medications as directed. Please refer to the Current Medication list given to you today.  *If you need a refill on your cardiac medications before your next appointment, please call your pharmacy*  Lab Work: None.  If you have labs (blood work) drawn today and your tests are completely normal, you will receive your results only by: Marland Kitchen MyChart Message (if you have MyChart) OR . A paper copy in the mail If you have any lab test that is abnormal or we need to change your treatment, we will call you to review the results.  Testing/Procedures: Non-Cardiac CT scanning, (CAT scanning), is a noninvasive, special x-ray that produces cross-sectional images of the body using x-rays and a computer. CT scans help physicians diagnose and treat medical conditions. For some CT exams, a contrast material is used to enhance visibility in the area of the body being studied. CT scans provide greater clarity and reveal more details than regular x-ray exams.    Follow-Up: At [facility], you and your health needs are our priority.  As part of our continuing mission to provide you with exceptional heart care, we have created designated Provider Care Teams.  These Care Teams include your primary Cardiologist (physician) and Advanced Practice Providers (APPs -  Physician Assistants and Nurse Practitioners) who all work together to provide you with the care you need, when you need it.  Your next appointment:   3 months  The format for your next appointment:   In Person  Provider:   Shirlee More, MD  Other Instructions

## 2019-07-03 NOTE — Addendum Note (Signed)
Addended by: Ashok Norris on: 07/03/2019 09:44 AM   Modules accepted: Orders

## 2019-07-04 ENCOUNTER — Telehealth: Payer: Self-pay | Admitting: *Deleted

## 2019-07-04 NOTE — Telephone Encounter (Signed)
Called patient to schedule CT CA Score. Left message to call back to [phone]

## 2019-07-25 ENCOUNTER — Other Ambulatory Visit: Payer: Self-pay | Admitting: Cardiology

## 2019-07-25 DIAGNOSIS — I10 Essential (primary) hypertension: Secondary | ICD-10-CM

## 2019-07-26 ENCOUNTER — Other Ambulatory Visit: Payer: Self-pay

## 2019-07-26 ENCOUNTER — Ambulatory Visit (INDEPENDENT_AMBULATORY_CARE_PROVIDER_SITE_OTHER)
Admission: RE | Admit: 2019-07-26 | Discharge: 2019-07-26 | Disposition: A | Discharge status: Home / Self Care | Payer: Self-pay | Source: Ambulatory Visit | Attending: Cardiology | Admitting: Cardiology

## 2019-07-26 DIAGNOSIS — Z7189 Other specified counseling: Secondary | ICD-10-CM

## 2019-07-30 ENCOUNTER — Ambulatory Visit: Payer: Managed Care, Other (non HMO) | Admitting: Cardiology

## 2019-07-31 NOTE — Progress Notes (Signed)
Cardiology Office Note:    Date:  08/01/2019   ID:  Terry Stanley, DOB Feb 05, 1961, MRN [id number]  PCP:  Street, [name], MD  Cardiologist:  Shirlee More, MD    Referring MD: [address], [name], *    ASSESSMENT:    1. High coronary artery calcium score   2. Resistant hypertension   3. Pure hypercholesterolemia    PLAN:    In order of problems listed above:  1. He has a high risk coronary calcium score and will undergo cardiac CTA if flow-limiting stenosis would benefit from revascularization. 2. Well-controlled on a multidrug regimen continue current treatment 3. Continue a statin with a high calcium score   Next appointment: 3 months   Medication Adjustments/Labs and Tests Ordered: Current medicines are reviewed at length with the patient today.  Concerns regarding medicines are outlined above.  No orders of the defined types were placed in this encounter.  No orders of the defined types were placed in this encounter.   Chief Complaint  Patient presents with  . Follow-up    after cardiac CTA   History of Present Illness:    Remone Sapp is a 58 y.o. male with a hx of resistant hypertension and dyslipidemia last seen 07/04/2019 and after cardiac counseling decided to undergo cardiac CTA concerned about his prognosis. Compliance with diet, lifestyle and medications: Yes  CT cardiac calcium score 07/26/2019:  IMPRESSION: 1.  No acute findings in the imaged extracardiac chest. 2.  Emphysema (ICD10-J43.9). 3. Multiple pulmonary nodules, primarily calcified granulomas. Right lower lobe pulmonary nodule of 4 mm is not calcified. Non-contrast chest CT can be considered in 12 months, given risk factors for primary bronchogenic carcinoma.  FINDINGS: Ascending Aorta: Normal Caliber.  Calcifications noted. Pericardium: Normal Coronary arteries: Normal coronary origins. Coronary calcifications in the LAD, RCA and LCx. IMPRESSION: Coronary calcium score of  300. This was 88th percentile for age and sex matched control.  He is a previous smoker of a follow-up noncontrast CT for small nodules 1 year He has good healthcare literacy understands the coronary artery calcification does not equal stenosis and for further evaluation will undergo cardiac CTA.  He is having no chest pain shortness of breath edema or palpitation.  Home blood pressure runs less than 120-130/70-80 and he takes a statin. Past Medical History:  Diagnosis Date  . Asthma 05/26/2017  . Essential hypertension 08/23/2016  . Hyperlipidemia 10/15/2011  . Low back pain 10/15/2011  . 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 PONV (postoperative nausea and vomiting) 05/26/2017  . Spinal stenosis of lumbar region 10/15/2011  . 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 Thoracic or lumbosacral neuritis or radiculitis 10/15/2011    Past Surgical History:  Procedure Laterality Date  . APPENDECTOMY    . CARPAL TUNNEL RELEASE    . NECK SURGERY  2017   fatty tumer removed  . NECK SURGERY  2020   neck fusion C4-C7  . thumb surgery Right     Current Medications: Current Meds  Medication Sig  . aspirin EC 81 MG tablet Take 81 mg by mouth daily.  Marland Kitchen atorvastatin (LIPITOR) 40 MG tablet Take 40 mg by mouth daily.   Marland Kitchen BREO ELLIPTA 200-25 MCG/INH AEPB Inhale 1 puff into the lungs daily.   . cloNIDine (CATAPRES) 0.1 MG tablet TAKE ONE TABLET TWICE DAILY  . fluticasone (FLONASE) 50 MCG/ACT nasal spray Place 1 spray into both nostrils daily as needed for allergies.   Marland Kitchen  hydrALAZINE (APRESOLINE) 50 MG tablet Take 50 mg by mouth 2 (two) times daily.   . hydrochlorothiazide (HYDRODIURIL) 12.5 MG tablet TAKE ONE (1) TABLET ONCE DAILY  . HYDROcodone-acetaminophen (NORCO) 10-325 MG tablet 1 tablet every 6 (six) hours as needed for moderate pain.   Marland Kitchen ibuprofen (ADVIL,MOTRIN) 200 MG tablet Take 600 mg by mouth every 6 (six) hours as needed.   . methocarbamol (ROBAXIN) 750 MG tablet Take 750  mg by mouth every 8 (eight) hours as needed for muscle spasms.  . metoprolol tartrate (LOPRESSOR) 25 MG tablet TAKE ONE TABLET TWICE DAILY  . olmesartan (BENICAR) 40 MG tablet TAKE ONE (1) TABLET ONCE DAILY  . Omega-3 Fatty Acids (FISH OIL OMEGA-3 PO) Take 1 capsule by mouth daily.   Marland Kitchen omeprazole (PRILOSEC) 20 MG capsule Take 20 mg by mouth daily.   . pregabalin (LYRICA) 50 MG capsule Take 50 mg by mouth 2 (two) times daily as needed.   . tamsulosin (FLOMAX) 0.4 MG CAPS capsule Take 0.4 mg by mouth daily.  . traZODone (DESYREL) 50 MG tablet Take 25 mg by mouth at bedtime.   . vitamin C (ASCORBIC ACID) 500 MG tablet Take 500 mg by mouth daily.   . Zinc Acetate, Oral, (ZINC ACETATE PO) Take 1 tablet by mouth daily.     Allergies:   Bee venom and Carvedilol   Social History   Socioeconomic History  . Marital status: Married    Spouse name: Not on file  . Number of children: Not on file  . Years of education: Not on file  . Highest education level: Not on file  Occupational History  . Not on file  Social Needs  . Financial resource strain: Not on file  . Food insecurity    Worry: Not on file    Inability: Not on file  . Transportation needs    Medical: Not on file    Non-medical: Not on file  Tobacco Use  . Smoking status: Former Smoker    Types: Cigarettes  . Smokeless tobacco: Never Used  Substance and Sexual Activity  . Alcohol use: Yes    Alcohol/week: 12.0 standard drinks    Types: 12 Cans of beer per week    Comment: a week  . Drug use: No  . Sexual activity: Not on file  Lifestyle  . Physical activity    Days per week: Not on file    Minutes per session: Not on file  . Stress: Not on file  Relationships  . Social Herbalist on phone: Not on file    Gets together: Not on file    Attends religious service: Not on file    Active member of club or organization: Not on file    Attends meetings of clubs or organizations: Not on file    Relationship  status: Not on file  Other Topics Concern  . Not on file  Social History Narrative  . Not on file     Family History: The patient's family history includes Angina in his mother; Hypertension in his brother. ROS:   Please see the history of present illness.    All other systems reviewed and are negative.  EKGs/Labs/Other Studies Reviewed:    The following studies were reviewed today:  EKG: Independently reviewed from 10/24/2018 showed sinus rhythm and that EKG is normal  EKG today sinus rhythm and remains normal personally reviewed Recent Labs: 02/13/2019: ALT 20; BUN 14; Creatinine, Ser 1.21; Potassium 4.9; Sodium  140  Recent Lipid Panel    Component Value Date/Time   CHOL 175 02/13/2019 1051   TRIG 197 (H) 02/13/2019 1051   HDL 47 02/13/2019 1051   CHOLHDL 3.7 02/13/2019 1051   LDLCALC 89 02/13/2019 1051    Physical Exam:    VS:  BP 136/72 (BP Location: Right Arm, Patient Position: Sitting, Cuff Size: Large)   Pulse 65   Ht 6' (1.829 m)   Wt 243 lb (110.2 kg)   SpO2 98%   BMI 32.96 kg/m     Wt Readings from Last 3 Encounters:  08/01/19 243 lb (110.2 kg)  07/03/19 242 lb 12.8 oz (110.1 kg)  01/30/19 233 lb (105.7 kg)     GEN:  Well nourished, well developed in no acute distress HEENT: Normal NECK: No JVD; No carotid bruits LYMPHATICS: No lymphadenopathy CARDIAC: RRR, no murmurs, rubs, gallops RESPIRATORY:  Clear to auscultation without rales, wheezing or rhonchi  ABDOMEN: Soft, non-tender, non-distended MUSCULOSKELETAL:  No edema; No deformity  SKIN: Warm and dry NEUROLOGIC:  Alert and oriented x 3 PSYCHIATRIC:  Normal affect    Signed, Shirlee More, MD  08/01/2019 11:54 AM    [place]

## 2019-08-01 ENCOUNTER — Ambulatory Visit (INDEPENDENT_AMBULATORY_CARE_PROVIDER_SITE_OTHER): Payer: Managed Care, Other (non HMO) | Admitting: Cardiology

## 2019-08-01 ENCOUNTER — Encounter: Payer: Self-pay | Admitting: Cardiology

## 2019-08-01 VITALS — BP 136/72 | HR 65 | Ht 72.0 in | Wt 243.0 lb

## 2019-08-01 DIAGNOSIS — Z01812 Encounter for preprocedural laboratory examination: Secondary | ICD-10-CM

## 2019-08-01 DIAGNOSIS — E7800 Pure hypercholesterolemia, unspecified: Secondary | ICD-10-CM | POA: Diagnosis not present

## 2019-08-01 DIAGNOSIS — R931 Abnormal findings on diagnostic imaging of heart and coronary circulation: Secondary | ICD-10-CM | POA: Insufficient documentation

## 2019-08-01 DIAGNOSIS — I10 Essential (primary) hypertension: Secondary | ICD-10-CM

## 2019-08-01 HISTORY — DX: Abnormal findings on diagnostic imaging of heart and coronary circulation: R93.1

## 2019-08-01 NOTE — Patient Instructions (Signed)
Medication Instructions:  Your physician recommends that you continue on your current medications as directed. Please refer to the Current Medication list given to you today.  *If you need a refill on your cardiac medications before your next appointment, please call your pharmacy*  Lab Work: Your physician recommends that you return for lab work within 3-7 days before your cardiac CTA: BMP, lipid panel. Please return to our office for lab work, no appointment needed. Please fast beforehand.   If you have labs (blood work) drawn today and your tests are completely normal, you will receive your results only by: Marland Kitchen MyChart Message (if you have MyChart) OR . A paper copy in the mail If you have any lab test that is abnormal or we need to change your treatment, we will call you to review the results.  Testing/Procedures: You had an EKG today.   Your physician has requested that you have cardiac CT. Cardiac computed tomography (CT) is a painless test that uses an x-ray machine to take clear, detailed pictures of your heart. For further information please visit [email or web address]. Please follow instruction sheet as given.  Your cardiac CT will be scheduled at one of the below locations:   [facility] [address] [place], [place] [address] [phone]  If scheduled at [facility], please arrive at the [facility] main entrance of [facility] 30-45 minutes prior to test start time. Proceed to the [facility] Radiology Department (first floor) to check-in and test prep.   Please follow these instructions carefully (unless otherwise directed):  Hold all erectile dysfunction medications at least 3 days (72 hrs) prior to test.  On the Night Before the Test: . Be sure to Drink plenty of water. . Do not consume any caffeinated/decaffeinated beverages or chocolate 12 hours prior to your test. . Do not take any antihistamines 12 hours prior to your test.  On the  Day of the Test: . Drink plenty of water. Do not drink any water within one hour of the test. . Do not eat any food 4 hours prior to the test. . You may take your regular medications prior to the test.  . Take metoprolol (Lopressor) two hours prior to test. . HOLD Hydrochlorothiazide morning of the test.       After the Test: . Drink plenty of water. . After receiving IV contrast, you may experience a mild flushed feeling. This is normal. . On occasion, you may experience a mild rash up to 24 hours after the test. This is not dangerous. If this occurs, you can take Benadryl 25 mg and increase your fluid intake. . If you experience trouble breathing, this can be serious. If it is severe call 911 IMMEDIATELY. If it is mild, please call our office.   Once we have confirmed authorization from your insurance company, we will call you to set up a date and time for your test.   For non-scheduling related questions, please contact the cardiac imaging nurse navigator should you have any questions/concerns: Marchia Bond, RN Navigator Cardiac Imaging [facility] [phone] Office   Follow-Up: At [facility], you and your health needs are our priority.  As part of our continuing mission to provide you with exceptional heart care, we have created designated Provider Care Teams.  These Care Teams include your primary Cardiologist (physician) and Advanced Practice Providers (APPs -  Physician Assistants and Nurse Practitioners) who all work together to provide you with  the care you need, when you need it.  Your next appointment:   3 month(s)  The format for your next appointment:   In Person  Provider:   Shirlee More, MD    Cardiac CT Angiogram  A cardiac CT angiogram is a procedure to look at the heart and the area around the heart. It may be done to help find the cause of chest pains or other symptoms of heart disease. During this procedure, a large X-ray  machine, called a CT scanner, takes detailed pictures of the heart and the surrounding area after a dye (contrast material) has been injected into blood vessels in the area. The procedure is also sometimes called a coronary CT angiogram, coronary artery scanning, or CTA. A cardiac CT angiogram allows the health care provider to see how well blood is flowing to and from the heart. The health care provider will be able to see if there are any problems, such as:  Blockage or narrowing of the coronary arteries in the heart.  Fluid around the heart.  Signs of weakness or disease in the muscles, valves, and tissues of the heart. Tell a health care provider about:  Any allergies you have. This is especially important if you have had a previous allergic reaction to contrast dye.  All medicines you are taking, including vitamins, herbs, eye drops, creams, and over-the-counter medicines.  Any blood disorders you have.  Any surgeries you have had.  Any medical conditions you have.  Whether you are pregnant or may be pregnant.  Any anxiety disorders, chronic pain, or other conditions you have that may increase your stress or prevent you from lying still. What are the risks? Generally, this is a safe procedure. However, problems may occur, including:  Bleeding.  Infection.  Allergic reactions to medicines or dyes.  Damage to other structures or organs.  Kidney damage from the dye or contrast that is used.  Increased risk of cancer from radiation exposure. This risk is low. Talk with your health care provider about: ? The risks and benefits of testing. ? How you can receive the lowest dose of radiation. What happens before the procedure?  Wear comfortable clothing and remove any jewelry, glasses, dentures, and hearing aids.  Follow instructions from your health care provider about eating and drinking. This may include: ? For 12 hours before the test - avoid caffeine. This includes tea,  coffee, soda, energy drinks, and diet pills. Drink plenty of water or other fluids that do not have caffeine in them. Being well-hydrated can prevent complications. ? For 4-6 hours before the test - stop eating and drinking. The contrast dye can cause nausea, but this is less likely if your stomach is empty.  Ask your health care provider about changing or stopping your regular medicines. This is especially important if you are taking diabetes medicines, blood thinners, or medicines to treat erectile dysfunction. What happens during the procedure?  Hair on your chest may need to be removed so that small sticky patches called electrodes can be placed on your chest. These will transmit information that helps to monitor your heart during the test.  An IV tube will be inserted into one of your veins.  You might be given a medicine to control your heart rate during the test. This will help to ensure that good images are obtained.  You will be asked to lie on an exam table. This table will slide in and out of the CT machine during the  procedure.  Contrast dye will be injected into the IV tube. You might feel warm, or you may get a metallic taste in your mouth.  You will be given a medicine (nitroglycerin) to relax (dilate) the arteries in your heart.  The table that you are lying on will move into the CT machine tunnel for the scan.  The person running the machine will give you instructions while the scans are being done. You may be asked to: ? Keep your arms above your head. ? Hold your breath. ? Stay very still, even if the table is moving.  When the scanning is complete, you will be moved out of the machine.  The IV tube will be removed. The procedure may vary among health care providers and hospitals. What happens after the procedure?  You might feel warm, or you may get a metallic taste in your mouth from the contrast dye.  You may have a headache from the nitroglycerin.  After the  procedure, drink water or other fluids to wash (flush) the contrast material out of your body.  Contact a health care provider if you have any symptoms of allergy to the contrast. These symptoms include: ? Shortness of breath. ? Rash or hives. ? A racing heartbeat.  Most people can return to their normal activities right after the procedure. Ask your health care provider what activities are safe for you.  It is up to you to get the results of your procedure. Ask your health care provider, or the department that is doing the procedure, when your results will be ready. Summary  A cardiac CT angiogram is a procedure to look at the heart and the area around the heart. It may be done to help find the cause of chest pains or other symptoms of heart disease.  During this procedure, a large X-ray machine, called a CT scanner, takes detailed pictures of the heart and the surrounding area after a dye (contrast material) has been injected into blood vessels in the area.  Ask your health care provider about changing or stopping your regular medicines before the procedure. This is especially important if you are taking diabetes medicines, blood thinners, or medicines to treat erectile dysfunction.  After the procedure, drink water or other fluids to wash (flush) the contrast material out of your body. This information is not intended to replace advice given to you by your health care provider. Make sure you discuss any questions you have with your health care provider. Document Released: 08/05/2008 Document Revised: 08/05/2017 Document Reviewed: 07/12/2016 Elsevier Patient Education  2020 [employer].

## 2019-09-27 ENCOUNTER — Other Ambulatory Visit: Payer: Self-pay | Admitting: Cardiology

## 2019-11-01 ENCOUNTER — Ambulatory Visit: Payer: Managed Care, Other (non HMO) | Admitting: Cardiology

## 2019-11-06 ENCOUNTER — Telehealth: Payer: Self-pay | Admitting: Cardiology

## 2019-11-06 MED ORDER — ATORVASTATIN CALCIUM 40 MG PO TABS: 40.0000 mg | ORAL_TABLET | Freq: Every day | ORAL | 3 refills | Status: DC

## 2019-11-06 MED ORDER — HYDRALAZINE HCL 50 MG PO TABS: 50.0000 mg | ORAL_TABLET | Freq: Two times a day (BID) | ORAL | 3 refills | Status: DC

## 2019-11-06 NOTE — Telephone Encounter (Signed)
Pt c/o medication issue:  1. Name of Medication:   hydrALAZINE (APRESOLINE) 50 MG tablet  atorvastatin (LIPITOR) 40 MG tablet  2. How are you currently taking this medication (dosage and times per day)? As directed  3. Are you having a reaction (difficulty breathing--STAT)? no  4. What is your medication issue? Patient states that he would like this medication switched over from Dr. Venetia Maxon prescribing it to Dr. Bettina Gavia since Dr. Bettina Gavia handles his BP. He states that he will need refills on these medications. Please send to [place], [place] once confirmed.

## 2019-11-06 NOTE — Telephone Encounter (Signed)
Yes to both 90 days refill 3

## 2019-11-06 NOTE — Telephone Encounter (Signed)
Spoke with pt and made him aware that Dr. Bettina Gavia was agreeable and I am sending prescriptions over.  Pt appreciative for call.

## 2019-11-06 NOTE — Telephone Encounter (Signed)
PCP normally fills Hydralazine and Atorvastatin for pt.  Pt would like for Dr. Bettina Gavia to start filling.  Will route to Dr. Bettina Gavia for approval to take over these meds.

## 2019-11-12 ENCOUNTER — Telehealth (HOSPITAL_COMMUNITY): Payer: Self-pay | Admitting: Emergency Medicine

## 2019-11-12 NOTE — Telephone Encounter (Signed)
Reaching out to patient to offer assistance regarding upcoming cardiac imaging study; pt verbalizes understanding of appt date/time, parking situation and where to check in, pre-test NPO status and medications ordered, and verified current allergies; name and call back number provided for further questions should they arise Ulric Salzman RN Navigator Cardiac Imaging [facility] [phone] office [phone] cell 

## 2019-11-14 ENCOUNTER — Ambulatory Visit (HOSPITAL_COMMUNITY)
Admission: RE | Admit: 2019-11-14 | Discharge: 2019-11-14 | Disposition: A | Discharge status: Home / Self Care | Payer: Managed Care, Other (non HMO) | Source: Ambulatory Visit | Attending: Cardiology | Admitting: Cardiology

## 2019-11-14 ENCOUNTER — Other Ambulatory Visit: Payer: Self-pay

## 2019-11-14 ENCOUNTER — Encounter: Payer: Managed Care, Other (non HMO) | Admitting: *Deleted

## 2019-11-14 DIAGNOSIS — I2510 Atherosclerotic heart disease of native coronary artery without angina pectoris: Secondary | ICD-10-CM

## 2019-11-14 DIAGNOSIS — Z006 Encounter for examination for normal comparison and control in clinical research program: Secondary | ICD-10-CM

## 2019-11-14 DIAGNOSIS — R931 Abnormal findings on diagnostic imaging of heart and coronary circulation: Secondary | ICD-10-CM | POA: Diagnosis present

## 2019-11-14 MED ORDER — NITROGLYCERIN 0.4 MG SL SUBL
SUBLINGUAL_TABLET | SUBLINGUAL | Status: AC
  Filled 2019-11-14: qty 2

## 2019-11-14 MED ORDER — IOHEXOL 350 MG/ML SOLN
80.0000 mL | Freq: Once | INTRAVENOUS | Status: AC | PRN
  Administered 2019-11-14: 09:00:00 80 mL via INTRAVENOUS

## 2019-11-14 NOTE — Research (Signed)
CADFEM Informed Consent   Subject Name: Terry Stanley  Subject met inclusion and exclusion criteria.  The informed consent form, study requirements and expectations were reviewed with the subject and questions and concerns were addressed prior to the signing of the consent form.  The subject verbalized understanding of the trial requirements.  The subject agreed to participate in the CADFEM trial and signed the informed consent at 0730 on 11/14/2019.  The informed consent was obtained prior to performance of any protocol-specific procedures for the subject.  A copy of the signed informed consent was given to the subject and a copy was placed in the subject's medical record.   [name].

## 2019-11-15 DIAGNOSIS — I2510 Atherosclerotic heart disease of native coronary artery without angina pectoris: Secondary | ICD-10-CM | POA: Diagnosis not present

## 2019-12-02 NOTE — Progress Notes (Signed)
Cardiology Office Note:    Date:  12/03/2019   ID:  Terry Stanley, DOB [birth year], MRN [id number]  PCP:  Street, [name], MD  Cardiologist:  Shirlee More, MD    Referring MD: [address] [name], *    ASSESSMENT:    1. Coronary artery disease of native artery of native heart with stable angina pectoris ([place])   2. Resistant hypertension   3. Pure hypercholesterolemia    PLAN:    In order of problems listed above:  1. Stable asymptomatic severe stenotic lesion continue medical therapy including antihypertensives aspirin statin 2. Stable continue current multidrug regimen 3. Continue statin 4. Needs a follow-up CT 6 months for pulmonary nodules   Next appointment: 6 months   Medication Adjustments/Labs and Tests Ordered: Current medicines are reviewed at length with the patient today.  Concerns regarding medicines are outlined above.  No orders of the defined types were placed in this encounter.  No orders of the defined types were placed in this encounter.   Chief Complaint  Patient presents with  . Follow-up    After cardiac CTA    History of Present Illness:    Terry Stanley is a 58 y.o. male with a hx of elevated coronary artery calcium score last seen 08/01/2019.  Calcium score performed 07/26/2019 was 388th percentile for age and sex matched control with three-vessel coronary artery calcification.  Follow-up cardiac CTA 11/15/2019 showed mild to moderate multivessel CAD worse than the right coronary artery 69% stenosis left anterior descending 25 to 29% left circumflex 20 to 49%.  FFR of all vessels was normal. 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  He has mild to moderate CAD no severe stenosis having no angina we will continue his medical therapy which was quite effective for hypertension and hyper  lipidemia. Past Medical History:  Diagnosis Date  . Asthma 05/26/2017  . Cervical radiculopathy due to degenerative joint disease of spine 06/12/2019  . Cervical radiculopathy due to degenerative joint disease of spine 06/12/2019  . Edema 04/07/2018  . Elevated coronary artery calcium score 08/01/2019  . Essential hypertension 08/23/2016  . Hyperlipidemia 10/15/2011  . Low back pain 10/15/2011  . 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 PONV (postoperative nausea and vomiting) 05/26/2017  . Resistant hypertension 08/23/2016  . Spinal stenosis of lumbar region 10/15/2011  . 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 Thoracic or lumbosacral neuritis or radiculitis 10/15/2011    Past Surgical History:  Procedure Laterality Date  . APPENDECTOMY    . CARPAL TUNNEL RELEASE    . NECK SURGERY  2017   fatty tumer removed  . NECK SURGERY  2020   neck fusion C4-C7  . thumb surgery Right     Current Medications: Current Meds  Medication Sig  . aspirin EC 81 MG tablet Take 81 mg by mouth daily.  Marland Kitchen atorvastatin (LIPITOR) 40 MG tablet Take 1 tablet (40 mg total) by mouth daily.  . cloNIDine (CATAPRES) 0.1 MG tablet TAKE ONE TABLET TWICE DAILY  . fluticasone (FLONASE) 50 MCG/ACT nasal spray Place 1 spray into both nostrils daily as needed for allergies.   . fluticasone furoate-vilanterol (BREO ELLIPTA) 200-25 MCG/INH AEPB Inhale 1 puff into the lungs as needed.  . hydrALAZINE (APRESOLINE) 50 MG tablet Take 1 tablet (50 mg total)  by mouth 2 (two) times daily.  . hydrochlorothiazide (HYDRODIURIL) 12.5 MG tablet TAKE ONE (1) TABLET ONCE DAILY  . HYDROcodone-acetaminophen (NORCO) 10-325 MG tablet 1 tablet every 6 (six) hours as needed for moderate pain.   Marland Kitchen ibuprofen (ADVIL,MOTRIN) 200 MG tablet Take 600 mg by mouth every 6 (six) hours as needed.   . methocarbamol (ROBAXIN) 750 MG tablet Take 750 mg by mouth every 8 (eight) hours as needed for muscle spasms.  . metoprolol tartrate  (LOPRESSOR) 25 MG tablet TAKE ONE TABLET TWICE DAILY  . olmesartan (BENICAR) 40 MG tablet TAKE ONE (1) TABLET ONCE DAILY  . Omega-3 Fatty Acids (FISH OIL OMEGA-3 PO) Take 1 capsule by mouth daily.   Marland Kitchen omeprazole (PRILOSEC) 20 MG capsule Take 20 mg by mouth daily.   . pregabalin (LYRICA) 50 MG capsule Take 50 mg by mouth 2 (two) times daily as needed.   . tamsulosin (FLOMAX) 0.4 MG CAPS capsule Take 0.4 mg by mouth daily.  . traZODone (DESYREL) 50 MG tablet Take 25 mg by mouth at bedtime.   . vitamin C (ASCORBIC ACID) 500 MG tablet Take 500 mg by mouth daily.   . Zinc Acetate, Oral, (ZINC ACETATE PO) Take 1 tablet by mouth daily.     Allergies:   Bee venom and Carvedilol   Social History   Socioeconomic History  . Marital status: Married    Spouse name: Not on file  . Number of children: Not on file  . Years of education: Not on file  . Highest education level: Not on file  Occupational History  . Not on file  Tobacco Use  . Smoking status: Former Smoker    Types: Cigarettes  . Smokeless tobacco: Never Used  Substance and Sexual Activity  . Alcohol use: Yes    Alcohol/week: 12.0 standard drinks    Types: 12 Cans of beer per week    Comment: a week  . Drug use: No  . Sexual activity: Not on file  Other Topics Concern  . Not on file  Social History Narrative  . Not on file   Social Determinants of Health   Financial Resource Strain:   . Difficulty of Paying Living Expenses:   Food Insecurity:   . Worried About Charity fundraiser in the Last Year:   . Arboriculturist in the Last Year:   Transportation Needs:   . Film/video editor (Medical):   Marland Kitchen Lack of Transportation (Non-Medical):   Physical Activity:   . Days of Exercise per Week:   . Minutes of Exercise per Session:   Stress:   . Feeling of Stress :   Social Connections:   . Frequency of Communication with Friends and Family:   . Frequency of Social Gatherings with Friends and Family:   . Attends  Religious Services:   . Active Member of Clubs or Organizations:   . Attends Archivist Meetings:   Marland Kitchen Marital Status:      Family History: The patient's family history includes Angina in his mother; Hypertension in his brother. ROS:   Please see the history of present illness.    All other systems reviewed and are negative.  EKGs/Labs/Other Studies Reviewed:    The following studies were reviewed today:   Recent Labs: 02/13/2019: ALT 20; BUN 14; Creatinine, Ser 1.21; Potassium 4.9; Sodium 140  Recent Lipid Panel    Component Value Date/Time   CHOL 175 02/13/2019 1051   TRIG 197 (H) 02/13/2019  1051   HDL 47 02/13/2019 1051   CHOLHDL 3.7 02/13/2019 1051   LDLCALC 89 02/13/2019 1051    Physical Exam:    VS:  BP (!) 148/68   Pulse 66   Temp 98 F (36.7 C)   Ht 6' (1.829 m)   Wt 240 lb (108.9 kg)   SpO2 97%   BMI 32.55 kg/m     Wt Readings from Last 3 Encounters:  12/03/19 240 lb (108.9 kg)  08/01/19 243 lb (110.2 kg)  07/03/19 242 lb 12.8 oz (110.1 kg)    Blood pressure by me 136/76 GEN:  Well nourished, well developed in no acute distress HEENT: Normal NECK: No JVD; No carotid bruits LYMPHATICS: No lymphadenopathy CARDIAC: \RRR, no murmurs, rubs, gallops RESPIRATORY:  Clear to auscultation without rales, wheezing or rhonchi  ABDOMEN: Soft, non-tender, non-distended MUSCULOSKELETAL:  No edema; No deformity  SKIN: Warm and dry NEUROLOGIC:  Alert and oriented x 3 PSYCHIATRIC:  Normal affect    Signed, Shirlee More, MD  12/03/2019 1:17 PM    [facility]

## 2019-12-03 ENCOUNTER — Ambulatory Visit: Payer: Managed Care, Other (non HMO) | Admitting: Cardiology

## 2019-12-03 ENCOUNTER — Other Ambulatory Visit: Payer: Self-pay

## 2019-12-03 ENCOUNTER — Encounter: Payer: Self-pay | Admitting: Cardiology

## 2019-12-03 VITALS — BP 148/68 | HR 66 | Temp 98.0°F | Ht 72.0 in | Wt 240.0 lb

## 2019-12-03 DIAGNOSIS — I10 Essential (primary) hypertension: Secondary | ICD-10-CM

## 2019-12-03 DIAGNOSIS — R918 Other nonspecific abnormal finding of lung field: Secondary | ICD-10-CM | POA: Diagnosis not present

## 2019-12-03 DIAGNOSIS — E7800 Pure hypercholesterolemia, unspecified: Secondary | ICD-10-CM

## 2019-12-03 DIAGNOSIS — I25118 Atherosclerotic heart disease of native coronary artery with other forms of angina pectoris: Secondary | ICD-10-CM | POA: Diagnosis not present

## 2019-12-03 MED ORDER — NITROGLYCERIN 0.4 MG SL SUBL: 0.4000 mg | SUBLINGUAL_TABLET | SUBLINGUAL | 3 refills | Status: DC | PRN

## 2019-12-03 NOTE — Patient Instructions (Addendum)
Medication Instructions:  Your physician has recommended you make the following change in your medication:  START Nitroglycerin 0.4mg  take one tablet by mouth every 5 minutes as needed for chest pain.  *If you need a refill on your cardiac medications before your next appointment, please call your pharmacy*   Lab Work: None If you have labs (blood work) drawn today and your tests are completely normal, you will receive your results only by: Marland Kitchen MyChart Message (if you have MyChart) OR . A paper copy in the mail If you have any lab test that is abnormal or we need to change your treatment, we will call you to review the results.   Testing/Procedures: None   Follow-Up: At [facility], you and your health needs are our priority.  As part of our continuing mission to provide you with exceptional heart care, we have created designated Provider Care Teams.  These Care Teams include your primary Cardiologist (physician) and Advanced Practice Providers (APPs -  Physician Assistants and Nurse Practitioners) who all work together to provide you with the care you need, when you need it.  We recommend signing up for the patient portal called "MyChart".  Sign up information is provided on this After Visit Summary.  MyChart is used to connect with patients for Virtual Visits (Telemedicine).  Patients are able to view lab/test results, encounter notes, upcoming appointments, etc.  Non-urgent messages can be sent to your provider as well.   To learn more about what you can do with MyChart, go to [email or web address].    Your next appointment:   6 month(s)  The format for your next appointment:   In Person  Provider:   Shirlee More, MD   Other Instructions Your physician recommends that you continue on your current medications as directed. Please refer to the Current Medication list given to you today.

## 2019-12-24 ENCOUNTER — Other Ambulatory Visit: Payer: Self-pay | Admitting: Cardiology

## 2020-01-28 ENCOUNTER — Other Ambulatory Visit: Payer: Self-pay | Admitting: Cardiology

## 2020-01-28 DIAGNOSIS — I10 Essential (primary) hypertension: Secondary | ICD-10-CM

## 2020-03-14 ENCOUNTER — Other Ambulatory Visit: Payer: Self-pay | Admitting: Cardiology

## 2020-09-24 ENCOUNTER — Other Ambulatory Visit: Payer: Self-pay | Admitting: Cardiology

## 2020-09-24 NOTE — Telephone Encounter (Signed)
Refill sent to pharmacy.   

## 2020-10-10 ENCOUNTER — Other Ambulatory Visit: Payer: Self-pay | Admitting: Cardiology

## 2020-10-10 NOTE — Telephone Encounter (Signed)
Refill sent to pharmacy.   

## 2020-11-10 ENCOUNTER — Other Ambulatory Visit: Payer: Self-pay | Admitting: Cardiology

## 2020-11-10 DIAGNOSIS — I10 Essential (primary) hypertension: Secondary | ICD-10-CM

## 2020-11-25 ENCOUNTER — Other Ambulatory Visit: Payer: Self-pay | Admitting: Cardiology

## 2020-12-06 IMAGING — CT CT HEART MORP W/ CTA COR W/ SCORE W/ CA W/CM &/OR W/O CM
4 of 7 series · 8 of 20 positions shown, 9 images · IV contrast (APPLIED)
Comparison: None.
COMPARISON: None.

Addendum:
EXAM:
OVER-READ INTERPRETATION  CT CHEST

The following report is an over-read performed by radiologist Dr.
Osuli Parast [REDACTED] on 11/14/2019. This over-read
does not include interpretation of cardiac or coronary anatomy or
pathology. The coronary CTA interpretation by the cardiologist is
attached.
HISTORY: 58 yo male CAD eval, high risk, asymptomatic, prior calcium score
Cardiac/Coronary CTA
TECHNIQUE: The patient was scanned on a Siemens Force scanner.
PROTOCOL: A 120 kV prospective scan was triggered in the descending thoracic
aorta at 111 HU's. Axial non-contrast 3 mm slices were carried out
through the heart. The data set was analyzed on a dedicated work
station and scored using the Agatson method. Gantry rotation speed
was 250 msecs and collimation was .6 mm. Beta blockade and 0.8 mg of
sl NTG was given. The 3D data set was reconstructed in 5% intervals
of the 67-82 % of the R-R cycle. Diastolic phases were analyzed on a
dedicated work station using MPR, MIP and VRT modes. The patient
received 80mL OMNIPAQUE IOHEXOL 350 MG/ML SOLN of contrast.

[Series 6: best diast 78 % · axial · 0.39mm/px · z∈[-196,-154]mm · 2 of 316 slices shown]
[im 106/316  vessel]
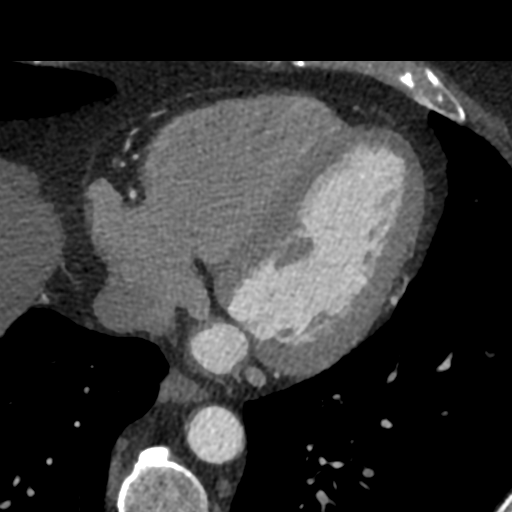
[im 211/316  vessel]
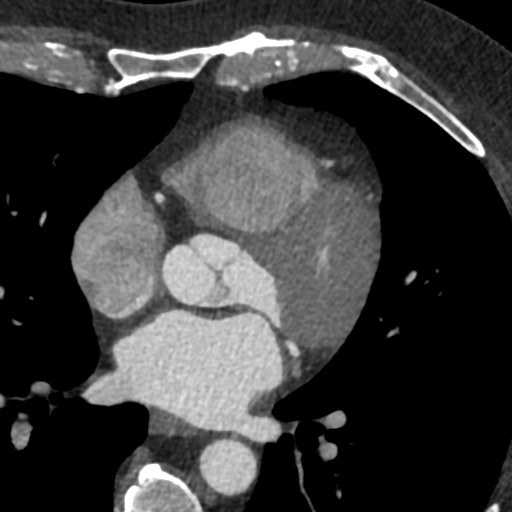

[Series 8: best syst · axial · 0.39mm/px · z∈[-196,-154]mm · 2 of 316 slices shown, 3 images]
[im 106/316  vessel]
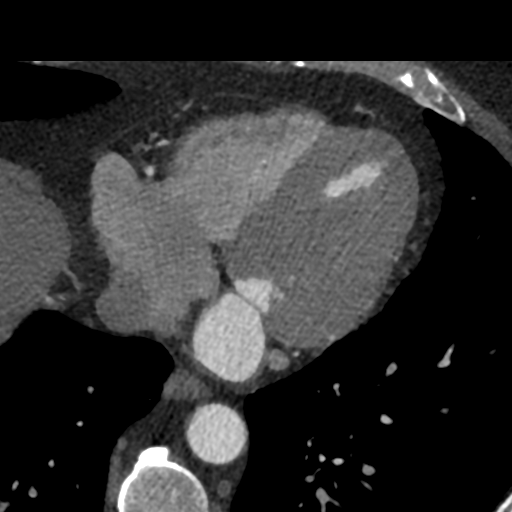
[im 106/316  lung]
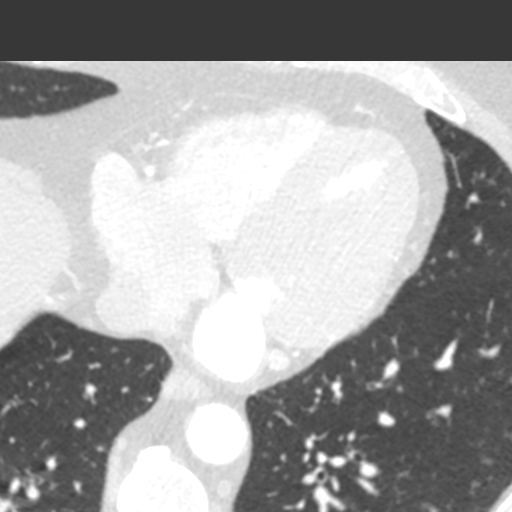
[im 211/316  vessel]
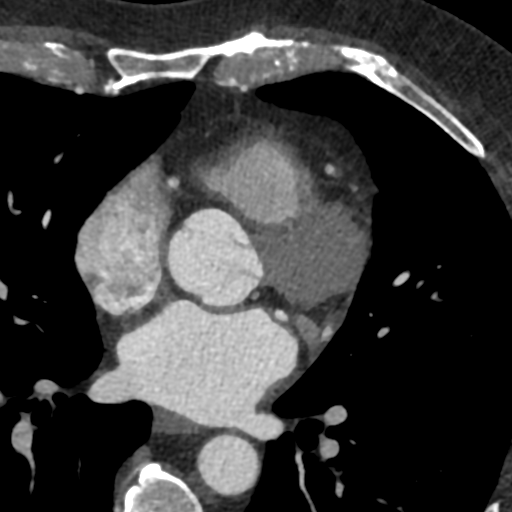

[Series 9: ts diast sharp · axial · 0.39mm/px · z∈[-196,-154]mm · 2 of 316 slices shown]
[im 106/316  lung]
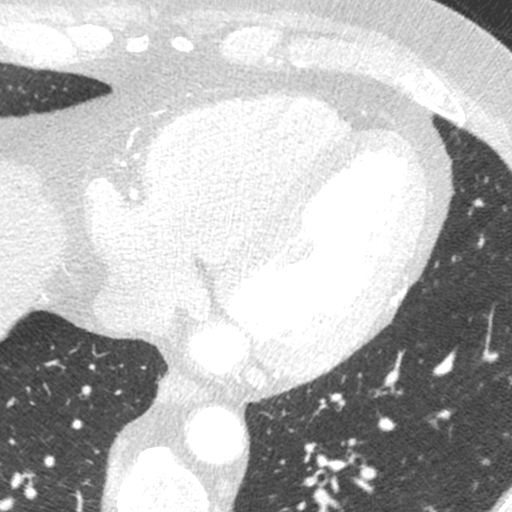
[im 211/316  lung]
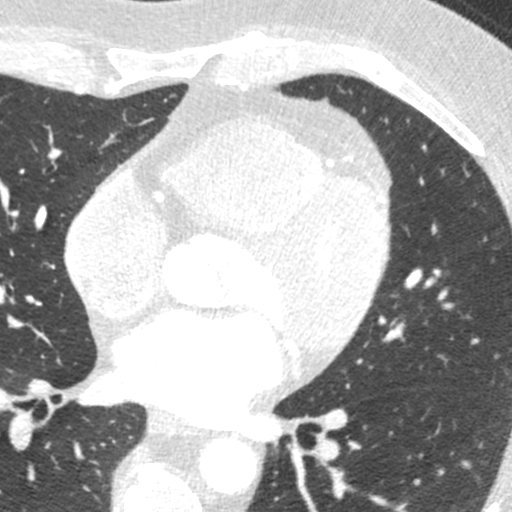

[Series 10: ts syst sharp · axial · 0.39mm/px · z∈[-196,-154]mm · 2 of 316 slices shown]
[im 106/316  lung]
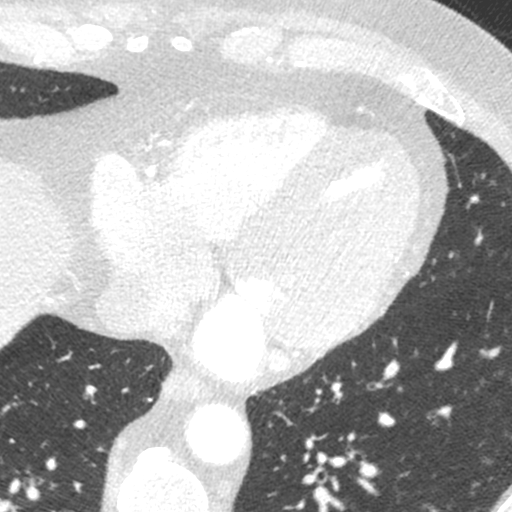
[im 211/316  lung]
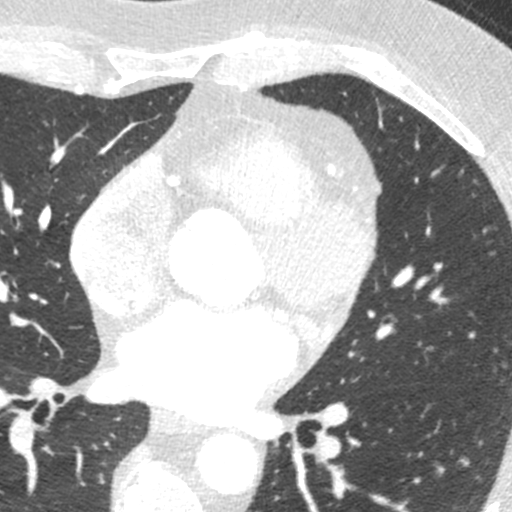

[8 of 20 positions shown; findings below may reference images not displayed]

FINDINGS: Vascular: Heart is normal size.  Visualized aorta normal caliber.

Mediastinum/Nodes: No adenopathy in the lower mediastinum or hila.

Lungs/Pleura: Small calcified granulomas in the right lung.
Noncalcified small peripheral nodules in the right lower lobe, the
largest 6-7 mm on image 17. Adjacent smaller nodule peripherally on
image 22. No confluent opacities or effusions.

Upper Abdomen: Imaging into the upper abdomen shows no acute
findings.

Musculoskeletal: Chest wall soft tissues are unremarkable. No acute
bony abnormality.
IMPRESSION: Old granulomatous disease.

Small subpleural nodules in the right lower lobe posteriorly, the
largest 6-7 mm. Non-contrast chest CT at 3-6 months is recommended.
If the nodules are stable at time of repeat CT, then future CT at
18-24 months (from today's scan) is considered optional for low-risk
patients, but is recommended for high-risk patients. This
recommendation follows the consensus statement: Guidelines for
Management of Incidental Pulmonary Nodules Detected on CT Images:
FINDINGS: Quality: Excellent (HR 52)

Coronary calcium score: The patient's coronary artery calcium score
is 344, which places the patient in the 89th percentile.

Coronary arteries: Normal coronary origins.  Right dominance.

Right Coronary Artery: Dominant vessel. Gives of the R-PDA and R-PLB
branches. Minimal 1-24% proximal mixed stenosis (EVBYVB5H). There is
moderate diffuse mixed 50-69% (YUTMUTQY) mid vessel stenosis that is
not likely flow-limiting. Minimal mixed proximal R-PLB branch
stenosis (EVBYVB5H).

Left Main Coronary Artery: Normal left main. Bifurcates into the LAD
and LCx arteries.

Left Anterior Descending Coronary Artery: Mild mixed 25-49% ostial
LAD stenosis (CADRADS 2). Proximal minimal 1-24% mixed stenosis
(EVBYVB5H) with mild non-calcified diffuse distal disease. There are
2 smaller proximal diagonal vessels without stenosis.

Left Circumflex Artery: Minimal 1-24% mixed proximal stenosis
(EVBYVB5H). The artery trifurcates, giving off a moderate-sized OM
branch with mild 25-49% disease (TLDMLDBM) and a smaller OM2 branch
without disease.

Aorta: Normal size, 33 mm at the mid ascending aorta (level of the
PA bifurcation) measured double oblique. Aortic atherosclerosis. No
dissection.

Aortic Valve: Tricuspid.  No calcifications.

Other findings:

Normal pulmonary vein drainage into the left atrium.

Normal left atrial appendage without a thrombus.

Dilated main pulmonary artery at 30 mm, suggestive of pulmonary
hypertension.
IMPRESSION: 1. Mild to moderate multivessel mixed CAD, worse in the RCA, CADRADS
= 3, likely non-obstructive. CT FFR will be performed and reported
separately.

2. Coronary calcium score of 344. This was 89th percentile for age
and sex matched control. Multivessel CAC noted.

3. Normal coronary origin with right dominance.

4.  Dilated main PA at 30 mm, suggestive of pulmonary hypertension.

5. Aggressive cardiovascular risk factor modification is encouraged.

*** End of Addendum ***
EXAM:
OVER-READ INTERPRETATION  CT CHEST

The following report is an over-read performed by radiologist Dr.
Osuli Parast [REDACTED] on 11/14/2019. This over-read
does not include interpretation of cardiac or coronary anatomy or
pathology. The coronary CTA interpretation by the cardiologist is
attached.
FINDINGS: Vascular: Heart is normal size.  Visualized aorta normal caliber.

Mediastinum/Nodes: No adenopathy in the lower mediastinum or hila.

Lungs/Pleura: Small calcified granulomas in the right lung.
Noncalcified small peripheral nodules in the right lower lobe, the
largest 6-7 mm on image 17. Adjacent smaller nodule peripherally on
image 22. No confluent opacities or effusions.

Upper Abdomen: Imaging into the upper abdomen shows no acute
findings.

Musculoskeletal: Chest wall soft tissues are unremarkable. No acute
bony abnormality.
IMPRESSION: Old granulomatous disease.

Small subpleural nodules in the right lower lobe posteriorly, the
largest 6-7 mm. Non-contrast chest CT at 3-6 months is recommended.
If the nodules are stable at time of repeat CT, then future CT at
18-24 months (from today's scan) is considered optional for low-risk
patients, but is recommended for high-risk patients. This
recommendation follows the consensus statement: Guidelines for
Management of Incidental Pulmonary Nodules Detected on CT Images:

## 2021-04-01 ENCOUNTER — Other Ambulatory Visit: Payer: Self-pay | Admitting: Cardiology

## 2021-07-15 ENCOUNTER — Other Ambulatory Visit: Payer: Self-pay | Admitting: Cardiology

## 2021-08-13 ENCOUNTER — Other Ambulatory Visit: Payer: Self-pay | Admitting: Cardiology

## 2021-08-13 DIAGNOSIS — I10 Essential (primary) hypertension: Secondary | ICD-10-CM

## 2021-09-16 ENCOUNTER — Other Ambulatory Visit: Payer: Self-pay

## 2021-09-16 ENCOUNTER — Ambulatory Visit: Payer: Managed Care, Other (non HMO) | Admitting: Allergy and Immunology

## 2021-09-16 ENCOUNTER — Encounter: Payer: Self-pay | Admitting: Allergy and Immunology

## 2021-09-16 VITALS — BP 130/82 | HR 72 | Resp 16 | Ht 72.0 in | Wt 254.8 lb

## 2021-09-16 DIAGNOSIS — J4540 Moderate persistent asthma, uncomplicated: Secondary | ICD-10-CM

## 2021-09-16 DIAGNOSIS — R918 Other nonspecific abnormal finding of lung field: Secondary | ICD-10-CM

## 2021-09-16 DIAGNOSIS — J3089 Other allergic rhinitis: Secondary | ICD-10-CM | POA: Diagnosis not present

## 2021-09-16 DIAGNOSIS — K219 Gastro-esophageal reflux disease without esophagitis: Secondary | ICD-10-CM | POA: Diagnosis not present

## 2021-09-16 DIAGNOSIS — J301 Allergic rhinitis due to pollen: Secondary | ICD-10-CM

## 2021-09-16 MED ORDER — IPRATROPIUM BROMIDE 0.06 % NA SOLN: NASAL | 5 refills | Status: DC

## 2021-09-16 MED ORDER — AIRDUO DIGIHALER 113-14 MCG/ACT IN AEPB: INHALATION_SPRAY | RESPIRATORY_TRACT | 5 refills | Status: DC

## 2021-09-16 MED ORDER — RYALTRIS 665-25 MCG/ACT NA SUSP
2.0000 | Freq: Two times a day (BID) | NASAL | 5 refills | Status: DC
Start: 2021-09-16 — End: 2022-11-08

## 2021-09-16 MED ORDER — PROAIR DIGIHALER 108 (90 BASE) MCG/ACT IN AEPB: INHALATION_SPRAY | RESPIRATORY_TRACT | 2 refills | Status: AC

## 2021-09-16 NOTE — Patient Instructions (Addendum)
°  1.  Allergen avoidance measures - dust mite, cat, pollen, mold  2.  Treat and prevent inflammation:  A. Ryaltris - 2 sprays each nostril 1-2 times per day (SP) B. AirDuo 113 - 1 inhalation 2 times per day (SP) (empty lungs)  3.  Treat and prevent reflux/LPR:  A.  Decrease caffeine and alcohol consumption B.  Replace throat clearing with swallowing/drinking maneuver C.  Increase omeprazole to 40 mg - 1 tablet twice a day  4.  If needed:  A. Proair digihaler - 2 inhalations every 4-6 hours (SP) B. Ipratropium 0.06% - 2 sprays each nostril every 6 hours to dry nose C. OTC antihistamine - Allegra / zyrtec / xyzal  5.  Visit with ENT and obtain Chest X-Ray to examine throat for hoarseness  6.  Immunotherapy???  7.  Return to clinic in 4 weeks or earlier if problem

## 2021-09-16 NOTE — Progress Notes (Signed)
[place] [facility] [place] [facility] [place] - [place]   Dear Dr. Venetia Maxon,  Thank you for referring Terry Stanley to the [facility] on 09/16/2021.   Below is a summation of this patient's evaluation and recommendations.  Thank you for your referral. I will keep you informed about this patient's response to treatment.   If you have any questions please do not hesitate to contact me.   Sincerely,  Jiles Prows, MD Allergy / Immunology [place]   ______________________________________________________________________    NEW PATIENT NOTE  Referring Provider: Street, [name], * Primary Provider: Street, [name], MD Date of office visit: 09/16/2021    Subjective:   Chief Complaint:  Terry Stanley (DOB: Nov 19, 1960) is a 61 y.o. male who presents to the clinic on 09/16/2021 with a chief complaint of Asthma and Allergic Rhinitis  .     HPI: Terry Stanley presents to this clinic in evaluation of persistent respiratory tract problems.  He has a history of childhood asthma that resolved around high school but then he unfortunately smoked for about 20 years and now he has developed recurrent issues with wheezing.  He does not really have a tremendous amount of shortness of breath or chest tightness but he will get short of breath if he exerts himself.  He does not have cold induced bronchospastic symptoms.  He contracted COVID in October 2022 and was given a Breo inhaler.  He is now using his Breo inhaler as a rescue inhaler about twice a week.  Apparently he was given a antihypertensive medication that also precipitated significant breathing problems but fortunately he is no longer on that medication.  He has a history of nasal congestion and constant runny nose.  There does not appear to be a gustatory component.  He can smell for the most part without any difficulty.  Provoking factors  for symptoms include exposure to perfumes and candles and cats and dust.  He does have itchy eyes on occasion for which he uses some Pataday.  His nose remains symptomatic even though he uses Flonase on a daily basis and uses Allegra on a daily basis.  He did see ENT about 10 years ago and apparently had a CT scan of his sinuses which may have identified a polyp.  He was given prolonged antibiotic administration and he did better regarding that issue.  At this point he has a sinus infection manifested as green bloody nasal discharge twice a year usually during the spring and fall.  He has hoarseness that has developed over the course of the past year.  He has constant throat clearing and a dry spot and drainage in his throat.  He does not have any swallowing problems.  His wife has noticed his hoarseness.  He does have reflux disease with regurgitation up into his throat which for which he uses Prilosec on a daily basis.  He does well with the use of Prilosec and as long as he uses his medication every day he does not have much problem with regurgitation.  He does drink 2 coffees through the day and drinks about a 12 pack of beer per week.  He does not consume chocolate.  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   aspirin EC 81 MG tablet Take 81 mg by mouth daily.   atorvastatin 40 MG tablet Commonly known as: LIPITOR Take 1 tablet (40 mg total) by mouth daily.   Breo Ellipta 200-25 MCG/ACT Aepb Generic drug: fluticasone furoate-vilanterol Inhale 1 puff into the lungs as needed.   cloNIDine 0.1 MG tablet Commonly known as: CATAPRES TAKE ONE TABLET TWICE DAILY   fluticasone 50 MCG/ACT nasal spray Commonly known as: FLONASE Place 1 spray into both nostrils daily as needed for allergies.   hydrALAZINE 50 MG tablet Commonly known as: APRESOLINE TAKE ONE TABLET TWICE DAILY   hydrochlorothiazide 12.5 MG tablet Commonly known as: HYDRODIURIL TAKE ONE (1) TABLET ONCE DAILY   HYDROcodone-acetaminophen 10-325 MG tablet Commonly known as: NORCO 1 tablet every 6 (six) hours as needed for moderate pain.   ibuprofen 200 MG tablet Commonly known as: ADVIL Take 600 mg by mouth every 6 (six) hours as needed.   methocarbamol 750 MG tablet Commonly known as: ROBAXIN Take 750 mg by mouth every 8 (eight) hours as needed for muscle spasms.   metoprolol tartrate 25 MG tablet Commonly known as: LOPRESSOR TAKE ONE (1) TABLET BY MOUTH TWO (2) TIMES DAILY   nitroGLYCERIN 0.4 MG SL tablet Commonly known as: NITROSTAT Place 1 tablet (0.4 mg total) under the tongue every 5 (five) minutes as needed for chest pain.   pregabalin 50 MG capsule Commonly known as: LYRICA Take 50 mg by mouth 2 (two) times daily as needed.   tamsulosin 0.4 MG Caps  capsule Commonly known as: FLOMAX Take 0.4 mg by mouth daily.   vitamin C 500 MG tablet Commonly known as: ASCORBIC ACID Take 500 mg by mouth daily.   ZINC ACETATE PO Take 1 tablet by mouth daily.    Review of systems negative except as noted in HPI / PMHx or noted below:  Review of Systems  Constitutional: Negative.   HENT: Negative.    Eyes: Negative.   Respiratory: Negative.    Cardiovascular: Negative.   Gastrointestinal: Negative.   Genitourinary: Negative.   Musculoskeletal: Negative.   Skin: Negative.   Neurological: Negative.   Endo/Heme/Allergies: Negative.   Psychiatric/Behavioral: Negative.     Family History  Problem Relation Age of Onset   Angina Mother    Hypertension Brother     Social History   Socioeconomic History   Marital status: Married    Spouse  name: Not on file   Number of children: Not on file   Years of education: Not on file   Highest education level: Not on file  Occupational History   Not on file  Tobacco Use   Smoking status: Former    Types: Cigarettes   Smokeless tobacco: Never  Vaping Use   Vaping Use: Never used  Substance and Sexual Activity   Alcohol use: Yes    Alcohol/week: 12.0 standard drinks    Types: 12 Cans of beer per week    Comment: a week   Drug use: No   Sexual activity: Not on file  Other Topics Concern   Not on file  Social History Narrative   Not on file   Environmental and Social history  Lives in a house with a dry environment, cat located inside the household, carpet in the bedroom, no plastic on the bed, no plastic on the pillow, no smoking ongoing with inside the household.  He is a retired [employer] manager.  Objective:   Vitals:   09/16/21 0836  BP: 130/82  Pulse: 72  Resp: 16  SpO2: 97%   Height: 6' (182.9 cm) Weight: 254 lb 12.8 oz (115.6 kg)  Physical Exam Constitutional:      Appearance: He is not diaphoretic.  HENT:     Head: Normocephalic.     Right Ear: Tympanic membrane,  ear canal and external ear normal.     Left Ear: Tympanic membrane, ear canal and external ear normal.     Nose: Nose normal. No mucosal edema or rhinorrhea.     Mouth/Throat:     Pharynx: Uvula midline. No oropharyngeal exudate.  Eyes:     Conjunctiva/sclera: Conjunctivae normal.  Neck:     Thyroid: No thyromegaly.     Trachea: Trachea normal. No tracheal tenderness or tracheal deviation.  Cardiovascular:     Rate and Rhythm: Normal rate and regular rhythm.     Heart sounds: Normal heart sounds, S1 normal and S2 normal. No murmur heard. Pulmonary:     Effort: No respiratory distress.     Breath sounds: Normal breath sounds. No stridor. No wheezing or rales.  Lymphadenopathy:     Head:     Right side of head: No tonsillar adenopathy.     Left side of head: No tonsillar adenopathy.     Cervical: No cervical adenopathy.  Skin:    Findings: No erythema or rash.     Nails: There is no clubbing.  Neurological:     Mental Status: He is alert.    Diagnostics: Allergy skin tests were performed.  He demonstrated hypersensitivity against house dust mite, cat, grasses, trees, and Alternaria mold.  Spirometry was performed and demonstrated an FEV1 of 2.59 @ 68 % of predicted. FEV1/FVC = 0.78.  Following the administration of nebulized albuterol his FEV1 did not change.  Results of a chest CT scan obtained 26 July 2019 identified the following:  Mediastinum/Nodes: No imaged thoracic adenopathy.  Lungs/Pleura: No imaged pleural fluid. Mild centrilobular emphysema. Tiny well-circumscribed pulmonary nodules, the majority of which are felt to be calcified bilaterally. A right lower lobe subpleural pulmonary nodule of 4 mm on 30/3 is not calcified.   Assessment and Plan:    1. Not well controlled moderate persistent asthma   2. Perennial allergic rhinitis   3. Seasonal allergic rhinitis due to pollen   4. LPRD (laryngopharyngeal reflux disease)   5. Lung nodules     1.  Allergen  avoidance measures - dust mite, cat, pollen, mold  2.  Treat and prevent inflammation:  A. Ryaltris - 2 sprays each nostril 1-2 times per day (SP) B. AirDuo 113 - 1 inhalation 2 times per day (SP) (empty lungs)  3.  Treat and prevent reflux/LPR:  A.  Decrease caffeine and alcohol consumption B.  Replace throat clearing with swallowing/drinking maneuver C.  Increase omeprazole to 40 mg - 1 tablet twice a day  4.  If needed:  A. Proair digihaler - 2 inhalations every 4-6 hours (SP) B. Ipratropium 0.06% - 2 sprays each nostril every 6 hours to dry nose C. OTC antihistamine - Allegra / zyrtec / xyzal  5.  Visit with ENT and obtain Chest X-Ray to examine throat for hoarseness  6.  Immunotherapy???  7.  Return to clinic in 4 weeks or earlier if problem  Terry Stanley appears to have 2 major insults to his respiratory tract.  His first is atopic disease and hopefully this will get under better control with attention to allergen avoidance measures and the use of anti-inflammatory agents for both his upper and lower airway.  His second insult is reflux in the form of LPR and we will treat him a little more aggressively for his reflux as noted above.  Because he was a long-term smoker and now has hoarseness we need to have his throat evaluated by ENT and we will get that arranged some point in the near future and also obtain a chest x-ray.  I will see him back in this clinic in 4 weeks or earlier if there is a problem.  Should he fail medical therapy he would be a candidate for immunotherapy.  Jiles Prows, MD Allergy / Immunology [facility]

## 2021-09-17 ENCOUNTER — Telehealth: Payer: Self-pay

## 2021-09-17 ENCOUNTER — Encounter: Payer: Self-pay | Admitting: Allergy and Immunology

## 2021-09-17 NOTE — Telephone Encounter (Signed)
Referral to [facility] ENT - Dr. Gaylyn Cheers Has been faxed over along with office notes and demographics.   ENT contact information was provided to patient at the time of visit.   Will call to follow up on referral

## 2021-09-23 ENCOUNTER — Other Ambulatory Visit: Payer: Self-pay | Admitting: Cardiology

## 2021-09-24 NOTE — Telephone Encounter (Signed)
Clonidine 0.1 mg tablet # 60 only with message for patient needs appointment for future refills sent to [id number] - [place], [place]

## 2021-09-29 ENCOUNTER — Other Ambulatory Visit: Payer: Self-pay | Admitting: Cardiology

## 2021-09-30 ENCOUNTER — Encounter: Payer: Self-pay | Admitting: Allergy and Immunology

## 2021-10-14 ENCOUNTER — Other Ambulatory Visit: Payer: Self-pay

## 2021-10-14 ENCOUNTER — Ambulatory Visit: Payer: Managed Care, Other (non HMO) | Admitting: Allergy and Immunology

## 2021-10-14 ENCOUNTER — Encounter: Payer: Self-pay | Admitting: Allergy and Immunology

## 2021-10-14 VITALS — BP 126/82 | HR 60 | Resp 16

## 2021-10-14 DIAGNOSIS — J4540 Moderate persistent asthma, uncomplicated: Secondary | ICD-10-CM

## 2021-10-14 DIAGNOSIS — R918 Other nonspecific abnormal finding of lung field: Secondary | ICD-10-CM | POA: Diagnosis not present

## 2021-10-14 DIAGNOSIS — K219 Gastro-esophageal reflux disease without esophagitis: Secondary | ICD-10-CM | POA: Diagnosis not present

## 2021-10-14 DIAGNOSIS — J3089 Other allergic rhinitis: Secondary | ICD-10-CM

## 2021-10-14 DIAGNOSIS — J301 Allergic rhinitis due to pollen: Secondary | ICD-10-CM

## 2021-10-14 MED ORDER — OMEPRAZOLE 40 MG PO CPDR: DELAYED_RELEASE_CAPSULE | ORAL | 5 refills | Status: DC

## 2021-10-14 NOTE — Progress Notes (Signed)
[facility] [place]   Follow-up Note  Referring Provider: Street, [name], * Primary Provider: Street, [name], MD Date of Office Visit: 10/14/2021  Subjective:   Terry Stanley (DOB: 07/04/1961) is a 61 y.o. male who returns to the [facility] on 10/14/2021 in re-evaluation of the following:  HPI: Thijs returns to this clinic in evaluation of asthma, allergic rhinitis, LPR, and history of lung nodules.  His last visit to this clinic was 16 September 2021.  He can tell a difference regarding his respiratory tract with his current therapy.  He does not have any coughing.  A large amount of the hoarseness and drainage in his throat and throat clearing has resolved.  He has much less runny nose at this point although he still little bit stuffy.  He has been consistently using a nasal steroid/antihistamine combination and a combination inhaler and has been using an omeprazole twice a day.  He has not really performed any allergen avoidance measures.  Allergies as of 10/14/2021       Reactions   Bee Venom Swelling   Carvedilol    Other reaction(s): Wheezing (ALLERGY/intolerance) Per Dr Bettina Gavia notes-caused exacerbation of asthma symptoms 07/2017        Medication List    AirDuo Digihaler 113-14 MCG/ACT Aepb Generic drug: Fluticasone-Salmeterol(sensor) 1 inhalation 2 times per day   ALPRAZolam 1 MG tablet Commonly known as: XANAX Take 0.5-1 mg by mouth daily as needed.   aspirin EC 81 MG tablet Take 81 mg by mouth daily.   atorvastatin 40 MG tablet Commonly known as: LIPITOR Take 1 tablet (40 mg total) by mouth daily.   Breo Ellipta 200-25 MCG/ACT Aepb Generic drug: fluticasone furoate-vilanterol Inhale 1 puff into the lungs as needed.   cloNIDine 0.1 MG tablet Commonly known as: CATAPRES Take 1 tablet (0.1 mg total) by mouth 2 (two) times daily. Needs appointment for future refills   fluticasone 50  MCG/ACT nasal spray Commonly known as: FLONASE Place 1 spray into both nostrils daily as needed for allergies.   hydrALAZINE 50 MG tablet Commonly known as: APRESOLINE TAKE ONE TABLET TWICE DAILY   hydrochlorothiazide 12.5 MG tablet Commonly known as: HYDRODIURIL TAKE ONE (1) TABLET ONCE DAILY   HYDROcodone-acetaminophen 10-325 MG tablet Commonly known as: NORCO 1 tablet every 6 (six) hours as needed for moderate pain.   ibuprofen 200 MG tablet Commonly known as: ADVIL Take 600 mg by mouth every 6 (six) hours as needed.   ipratropium 0.06 % nasal spray Commonly known as: ATROVENT 2 sprays in each nostril every 6 hours as needed to dry nose   methocarbamol 750 MG tablet Commonly known as: ROBAXIN Take 750 mg by mouth every 8 (eight) hours as needed for muscle spasms.   metoprolol tartrate 25 MG tablet Commonly known as: LOPRESSOR TAKE ONE (1) TABLET BY MOUTH TWO (2) TIMES DAILY   nitroGLYCERIN 0.4 MG SL tablet Commonly known as: NITROSTAT Place 1 tablet (0.4 mg total) under the tongue every 5 (five) minutes as needed for chest pain.   omeprazole 40 MG capsule Commonly known as: PRILOSEC Take 1 capsule twice daily   pregabalin 50 MG capsule Commonly known as: LYRICA Take 50 mg by mouth 2 (two) times daily as needed.   ProAir Digihaler 108 (90 Base) MCG/ACT Aepb Generic drug: Albuterol Sulfate (sensor) 2 inhalations every 4-6 hours as needed   Ryaltris 665-25 MCG/ACT Susp Generic drug: Olopatadine-Mometasone Place 2 sprays into  the nose 2 (two) times daily.   tamsulosin 0.4 MG Caps capsule Commonly known as: FLOMAX Take 0.4 mg by mouth daily.   vitamin C 500 MG tablet Commonly known as: ASCORBIC ACID Take 500 mg by mouth daily.   ZINC ACETATE PO Take 1 tablet by mouth daily.    Past Medical History:  Diagnosis Date   Asthma 05/26/2017   Cervical radiculopathy due to degenerative joint disease of spine 06/12/2019   Cervical radiculopathy due to  degenerative joint disease of spine 06/12/2019   Edema 04/07/2018   Elevated coronary artery calcium score 08/01/2019   Essential hypertension 08/23/2016   Hyperlipidemia 10/15/2011   Low back pain 10/15/2011   Neoplasm of skin of upper limb or shoulder, benign 01/30/2016   PONV (postoperative nausea and vomiting) 05/26/2017   Resistant hypertension 08/23/2016   Spinal stenosis of lumbar region 10/15/2011   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   Thoracic or lumbosacral neuritis or radiculitis 10/15/2011    Past Surgical History:  Procedure Laterality Date   APPENDECTOMY     CARPAL TUNNEL RELEASE     NECK SURGERY  2017   fatty tumer removed   NECK SURGERY  2020   neck fusion C4-C7   thumb surgery Right     Review of systems negative except as noted in HPI / PMHx or noted below:  Review of Systems  Constitutional: Negative.   HENT: Negative.    Eyes: Negative.   Respiratory: Negative.    Cardiovascular: Negative.   Gastrointestinal: Negative.   Genitourinary: Negative.   Musculoskeletal: Negative.   Skin: Negative.   Neurological: Negative.   Endo/Heme/Allergies: Negative.   Psychiatric/Behavioral: Negative.      Objective:   Vitals:   10/14/21 1006  BP: 126/82  Pulse: 60  Resp: 16  SpO2: 98%          Physical Exam Constitutional:      Appearance: He is not diaphoretic.  HENT:     Head: Normocephalic.     Right Ear: Tympanic membrane, ear canal and external ear normal.     Left Ear: Tympanic membrane, ear canal and external ear normal.     Nose: Nose normal. No mucosal edema or rhinorrhea.     Mouth/Throat:     Pharynx: Uvula midline. No oropharyngeal exudate.  Eyes:     Conjunctiva/sclera: Conjunctivae normal.  Neck:     Thyroid: No thyromegaly.     Trachea: Trachea normal. No tracheal tenderness or tracheal deviation.  Cardiovascular:     Rate and Rhythm: Normal rate and regular rhythm.     Heart sounds: Normal heart sounds, S1 normal and S2 normal. No murmur  heard. Pulmonary:     Effort: No respiratory distress.     Breath sounds: Normal breath sounds. No stridor. No wheezing or rales.  Lymphadenopathy:     Head:     Right side of head: No tonsillar adenopathy.     Left side of head: No tonsillar adenopathy.     Cervical: No cervical adenopathy.  Skin:    Findings: No erythema or rash.     Nails: There is no clubbing.  Neurological:     Mental Status: He is alert.    Diagnostics:    Spirometry was performed and demonstrated an FEV1 of 2.70 at 71 % of predicted.  Results of a chest x-ray obtained 16 September 2021 identifies numerous calcified granulomas more pronounced in the right lung than the left lung as seen on a previous  CT scan from 2020 and 2021.  No acute or progressive findings.  Assessment and Plan:   1. Asthma, moderate persistent, well-controlled   2. Perennial allergic rhinitis   3. Seasonal allergic rhinitis due to pollen   4. LPRD (laryngopharyngeal reflux disease)   5. Lung nodules     1.  Allergen avoidance measures - dust mite, cat, pollen, mold  2.  Treat and prevent inflammation:  A. Ryaltris - 2 sprays each nostril 1-2 times per day  B. AirDuo 113 - 1 inhalation 1-2 times per day  3.  Treat and prevent reflux/LPR:  A.  Decrease caffeine and alcohol consumption B.  Replace throat clearing with swallowing/drinking maneuver C.  Omeprazole to 40 mg - 1 tablet twice a day  4.  If needed:  A. Proair digihaler - 2 inhalations every 4-6 hours (SP) B. Ipratropium 0.06% - 2 sprays each nostril every 6 hours to dry nose C. OTC antihistamine - Allegra / zyrtec / xyzal  5.  Visit with ENT 10 November 2021  6.  Consider starting a course of immunotherapy  7. Further evaluation of Granulomas???  8.  Return to clinic in 6 months or earlier if problem  Ilijah is doing better on a large collection of medical therapy directed against inflammation of his airway and reflux induced respiratory disease as noted above.   He does have an appointment to visit with ENT to check his throat on 10 November 2021.  He should consider starting a course of immunotherapy in an attempt to get his atopic respiratory disease under better control and decrease his medication requirement.  At this point we are not going to have him undergo any further evaluation for his recurrent lung nodules/granulomas.  He should have a repeat chest x-ray next year.  Allena Katz, MD Allergy / Immunology [place]

## 2021-10-14 NOTE — Telephone Encounter (Signed)
Patient has appointment March 7th

## 2021-10-14 NOTE — Patient Instructions (Addendum)
°  1.  Allergen avoidance measures - dust mite, cat, pollen, mold  2.  Treat and prevent inflammation:  A. Ryaltris - 2 sprays each nostril 1-2 times per day  B. AirDuo 113 - 1 inhalation 1-2 times per day  3.  Treat and prevent reflux/LPR:  A.  Decrease caffeine and alcohol consumption B.  Replace throat clearing with swallowing/drinking maneuver C.  Omeprazole to 40 mg - 1 tablet twice a day  4.  If needed:  A. Proair digihaler - 2 inhalations every 4-6 hours (SP) B. Ipratropium 0.06% - 2 sprays each nostril every 6 hours to dry nose C. OTC antihistamine - Allegra / zyrtec / xyzal  5.  Visit with ENT 10 November 2021  6.  Consider starting a course of immunotherapy  7. Further evaluation of Granulomas???  8.  Return to clinic in 6 months or earlier if problem

## 2021-10-15 ENCOUNTER — Encounter: Payer: Self-pay | Admitting: Allergy and Immunology

## 2021-10-17 ENCOUNTER — Other Ambulatory Visit: Payer: Self-pay | Admitting: Cardiology

## 2021-11-05 ENCOUNTER — Other Ambulatory Visit: Payer: Self-pay | Admitting: Cardiology

## 2021-11-18 ENCOUNTER — Encounter: Payer: Self-pay | Admitting: Cardiology

## 2021-11-18 ENCOUNTER — Other Ambulatory Visit: Payer: Self-pay

## 2021-11-18 ENCOUNTER — Ambulatory Visit: Payer: Managed Care, Other (non HMO) | Admitting: Cardiology

## 2021-11-18 ENCOUNTER — Other Ambulatory Visit: Payer: Self-pay | Admitting: Cardiology

## 2021-11-18 VITALS — BP 150/80 | HR 67 | Ht 72.0 in | Wt 256.0 lb

## 2021-11-18 DIAGNOSIS — R918 Other nonspecific abnormal finding of lung field: Secondary | ICD-10-CM

## 2021-11-18 DIAGNOSIS — I10 Essential (primary) hypertension: Secondary | ICD-10-CM

## 2021-11-18 DIAGNOSIS — I25118 Atherosclerotic heart disease of native coronary artery with other forms of angina pectoris: Secondary | ICD-10-CM

## 2021-11-18 DIAGNOSIS — E7800 Pure hypercholesterolemia, unspecified: Secondary | ICD-10-CM

## 2021-11-18 MED ORDER — ATORVASTATIN CALCIUM 40 MG PO TABS: 40.0000 mg | ORAL_TABLET | Freq: Every day | ORAL | 3 refills | Status: DC

## 2021-11-18 MED ORDER — CLONIDINE HCL 0.1 MG PO TABS: 0.1000 mg | ORAL_TABLET | Freq: Two times a day (BID) | ORAL | 3 refills | Status: DC

## 2021-11-18 MED ORDER — HYDROCHLOROTHIAZIDE 12.5 MG PO TABS
ORAL_TABLET | ORAL | 2 refills | Status: DC
Start: 2021-11-18 — End: 2022-08-25

## 2021-11-18 MED ORDER — HYDRALAZINE HCL 50 MG PO TABS: 50.0000 mg | ORAL_TABLET | Freq: Two times a day (BID) | ORAL | 3 refills | Status: DC

## 2021-11-18 MED ORDER — METOPROLOL TARTRATE 25 MG PO TABS: ORAL_TABLET | ORAL | 2 refills | Status: DC

## 2021-11-18 MED ORDER — NITROGLYCERIN 0.4 MG SL SUBL: 0.4000 mg | SUBLINGUAL_TABLET | SUBLINGUAL | 5 refills | Status: DC | PRN

## 2021-11-18 NOTE — Progress Notes (Signed)
?Cardiology Office Note:   ? ?Date:  11/18/2021  ? ?ID:  Douglass Dunshee, DOB March 11, 1961, MRN [id number] ? ?PCP:  Street, [name], MD  ?Cardiologist:  Shirlee More, MD   ? ?Referring MD: Street, [name], *  ? ? ?ASSESSMENT:   ? ?1. Coronary artery disease of native artery of native heart with stable angina pectoris ([place])   ?2. Resistant hypertension   ?3. Pure hypercholesterolemia   ? ?PLAN:   ? ?In order of problems listed above: ? ?Stable CAD having no angina continue current medical therapy including aspirin high intensity statin beta-blocker and antihypertensive agents.  At this time I think he needs repeat cardiac imaging.  He did have granuloma/nodule subpleural on CT he did have a chest x-ray but unknown ask him to do the follow-up CT scan noncontrast ?Stable he needs to restart clonidine I will ask him to trend his blood pressures at home for the next few weeks and send a list to me through [place] he is on a multidrug regimen including essentially active clonidine hydralazine thiazide diuretic beta-blocker. ?Continue statin ? ? ?Next appointment: 6 months ? ? ?Medication Adjustments/Labs and Tests Ordered: ?Current medicines are reviewed at length with the patient today.  Concerns regarding medicines are outlined above.  ?No orders of the defined types were placed in this encounter. ? ?No orders of the defined types were placed in this encounter. ? ? ?Chief Complaint  ?Patient presents with  ? Follow-up  ? Coronary Artery Disease  ? Hypertension  ? ? ?History of Present Illness:   ? ?Lew Prout is a 61 y.o. male with a hx of elevated coronary artery calcium score 88th percentile three-vessel coronary artery calcification and subsequent cardiac CTA 11/15/2019 showing mild to moderate multivessel CAD right coronary artery 50 to 69% mid vessel LAD 20 to 49% ostial proximal left circumflex 20 to 49% marginal stenosis last seen 11/14/2019 he also had several small subpleural nodules with  recommendations for f.ollow-up noncontrast CT. He also had resistant difficult to treat hypertension and hyperlipidemia ? ?Compliance with diet, lifestyle and medications: Yes unfortunately ran out of clonidine last week ? ?Overall he is doing well.  A lot of stress when he ran for sure through the campaign he gained weight but he is back to a good diet sodium restriction and physical activity ?No angina edema shortness of breath palpitation or syncope ?Recent labs show a lipid profile 10/26/2021 cholesterol 165 LDL 89 triglycerides 414 HDL 37 A1c 5.3% hemoglobin 12.4 creatinine 1.5 ? ?In the allergy immunology in [place] chest x-ray in January showed multiple granulomas ?Past Medical History:  ?Diagnosis Date  ? Asthma 05/26/2017  ? Cervical radiculopathy due to degenerative joint disease of spine 06/12/2019  ? Cervical radiculopathy due to degenerative joint disease of spine 06/12/2019  ? Edema 04/07/2018  ? Elevated coronary artery calcium score 08/01/2019  ? Essential hypertension 08/23/2016  ? Hyperlipidemia 10/15/2011  ? Low back pain 10/15/2011  ? Neoplasm of skin of upper limb or shoulder, benign 01/30/2016  ? PONV (postoperative nausea and vomiting) 05/26/2017  ? Resistant hypertension 08/23/2016  ? Spinal stenosis of lumbar region 10/15/2011  ? 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 Thoracic or lumbosacral neuritis or radiculitis 10/15/2011  ? ? ?Past Surgical History:  ?Procedure Laterality Date  ? APPENDECTOMY    ? CARPAL TUNNEL RELEASE    ? NECK SURGERY  2017  ? fatty tumer removed  ? NECK SURGERY  2020  ? neck fusion C4-C7  ?  thumb surgery Right   ? ? ?Current Medications: ?Current Meds  ?Medication Sig  ? Albuterol Sulfate, sensor, (PROAIR DIGIHALER) 108 (90 Base) MCG/ACT AEPB 2 inhalations every 4-6 hours as needed  ? ALPRAZolam (XANAX) 1 MG tablet Take 0.5-1 mg by mouth daily as needed.  ? aspirin EC 81 MG tablet Take 81 mg by mouth daily.  ? atorvastatin (LIPITOR) 40 MG tablet Take 1 tablet (40 mg total) by  mouth daily.  ? cloNIDine (CATAPRES) 0.1 MG tablet TAKE 1 TABLET (0.1 MG TOTAL) BY MOUTH 2 (TWO) TIMES DAILY. NEEDS APPOINTMENT FOR FUTURE REFILLS  ? Fluticasone-Salmeterol,sensor, (AIRDUO DIGIHALER) 113-14 MCG/ACT AEPB 1 inhalation 2 times per day  ? hydrALAZINE (APRESOLINE) 50 MG tablet TAKE ONE TABLET TWICE DAILY  ? hydrochlorothiazide (HYDRODIURIL) 12.5 MG tablet TAKE ONE (1) TABLET ONCE DAILY  ? HYDROcodone-acetaminophen (NORCO) 10-325 MG tablet 1 tablet every 6 (six) hours as needed for moderate pain.   ? ibuprofen (ADVIL,MOTRIN) 200 MG tablet Take 600 mg by mouth every 6 (six) hours as needed.   ? ipratropium (ATROVENT) 0.06 % nasal spray 2 sprays in each nostril every 6 hours as needed to dry nose  ? methocarbamol (ROBAXIN) 750 MG tablet Take 750 mg by mouth every 8 (eight) hours as needed for muscle spasms.  ? metoprolol tartrate (LOPRESSOR) 25 MG tablet TAKE ONE (1) TABLET BY MOUTH TWO (2) TIMES DAILY  ? Olopatadine-Mometasone (RYALTRIS) [id number] MCG/ACT SUSP Place 2 sprays into the nose 2 (two) times daily.  ? omeprazole (PRILOSEC) 40 MG capsule Take 1 capsule twice daily  ? pregabalin (LYRICA) 50 MG capsule Take 50 mg by mouth 2 (two) times daily as needed.   ? tamsulosin (FLOMAX) 0.4 MG CAPS capsule Take 0.4 mg by mouth daily.  ? vitamin C (ASCORBIC ACID) 500 MG tablet Take 500 mg by mouth daily.   ? Zinc Acetate, Oral, (ZINC ACETATE PO) Take 1 tablet by mouth daily.  ?  ? ?Allergies:   Bee venom and Carvedilol  ? ?Social History  ? ?Socioeconomic History  ? Marital status: Married  ?  Spouse name: Not on file  ? Number of children: Not on file  ? Years of education: Not on file  ? Highest education level: Not on file  ?Occupational History  ? Not on file  ?Tobacco Use  ? Smoking status: Former  ?  Types: Cigarettes  ? Smokeless tobacco: Never  ?Vaping Use  ? Vaping Use: Never used  ?Substance and Sexual Activity  ? Alcohol use: Yes  ?  Alcohol/week: 12.0 standard drinks  ?  Types: 12 Cans of beer per  week  ?  Comment: a week  ? Drug use: No  ? Sexual activity: Not on file  ?Other Topics Concern  ? Not on file  ?Social History Narrative  ? Not on file  ? ?Social Determinants of Health  ? ?Financial Resource Strain: Not on file  ?Food Insecurity: Not on file  ?Transportation Needs: Not on file  ?Physical Activity: Not on file  ?Stress: Not on file  ?Social Connections: Not on file  ?  ? ?Family History: ?The patient's family history includes Angina in his mother; Hypertension in his brother. ?ROS:   ?Please see the history of present illness.    ?All other systems reviewed and are negative. ? ?EKGs/Labs/Other Studies Reviewed:   ? ?The following studies were reviewed today: ? ?EKG:  EKG ordered today and personally reviewed.  The ekg ordered today demonstrates sinus rhythm normal EKG ? ?  Recent Labs: ?No results found for requested labs within last 8760 hours.  ?Recent Lipid Panel ?   ?Component Value Date/Time  ? CHOL 175 02/13/2019 1051  ? TRIG 197 (H) 02/13/2019 1051  ? HDL 47 02/13/2019 1051  ? CHOLHDL 3.7 02/13/2019 1051  ? [place] 89 02/13/2019 1051  ? ? ?Physical Exam:   ? ?VS:  BP (!) 150/80 (BP Location: Right Arm)   Pulse 67   Ht 6' (1.829 m)   Wt 256 lb (116.1 kg)   SpO2 98%   BMI 34.72 kg/m?    ? ?Wt Readings from Last 3 Encounters:  ?11/18/21 256 lb (116.1 kg)  ?09/16/21 254 lb 12.8 oz (115.6 kg)  ?12/03/19 240 lb (108.9 kg)  ?  ? ?GEN:  Well nourished, well developed in no acute distress ?HEENT: Normal ?NECK: No JVD; No carotid bruits ?LYMPHATICS: No lymphadenopathy ?CARDIAC: RRR, no murmurs, rubs, gallops ?RESPIRATORY:  Clear to auscultation without rales, wheezing or rhonchi  ?ABDOMEN: Soft, non-tender, non-distended ?MUSCULOSKELETAL:  No edema; No deformity  ?SKIN: Warm and dry ?NEUROLOGIC:  Alert and oriented x 3 ?PSYCHIATRIC:  Normal affect  ? ? ?Signed, ?Shirlee More, MD  ?11/18/2021 2:40 PM    ?[place]  ?

## 2021-11-18 NOTE — Patient Instructions (Signed)
Medication Instructions:  ?Your physician recommends that you continue on your current medications as directed. Please refer to the Current Medication list given to you today. ? ?*If you need a refill on your cardiac medications before your next appointment, please call your pharmacy* ? ? ?Lab Work: ?NONE ?If you have labs (blood work) drawn today and your tests are completely normal, you will receive your results only by: ?MyChart Message (if you have MyChart) OR ?A paper copy in the mail ?If you have any lab test that is abnormal or we need to change your treatment, we will call you to review the results. ? ? ?Testing/Procedures: ?Non-Cardiac CT scanning, (CAT scanning), is a noninvasive, special x-ray that produces cross-sectional images of the body using x-rays and a computer. CT scans help physicians diagnose and treat medical conditions. For some CT exams, a contrast material is used to enhance visibility in the area of the body being studied. CT scans provide greater clarity and reveal more details than regular x-ray exams.  ? ? ?Follow-Up: ?At [facility], you and your health needs are our priority.  As part of our continuing mission to provide you with exceptional heart care, we have created designated Provider Care Teams.  These Care Teams include your primary Cardiologist (physician) and Advanced Practice Providers (APPs -  Physician Assistants and Nurse Practitioners) who all work together to provide you with the care you need, when you need it. ? ?We recommend signing up for the patient portal called "MyChart".  Sign up information is provided on this After Visit Summary.  MyChart is used to connect with patients for Virtual Visits (Telemedicine).  Patients are able to view lab/test results, encounter notes, upcoming appointments, etc.  Non-urgent messages can be sent to your provider as well.   ?To learn more about what you can do with MyChart, go to [email or web address].   ? ?Your next  appointment:   ?9 month(s) ? ?The format for your next appointment:   ?In Person ? ?Provider:   ?Shirlee More, MD  ? ? ?Other Instructions ?Check BP's at home daily and record. Send record to DR. Munley through [place] ?

## 2021-11-19 ENCOUNTER — Telehealth: Payer: Self-pay | Admitting: Cardiology

## 2021-11-19 NOTE — Telephone Encounter (Signed)
New Message: ? ? ? ?She needs an order for CT Test without Contrast, CPT  Code is 71250. Please fax this order to [place]  [phone] ?

## 2021-11-19 NOTE — Telephone Encounter (Signed)
Faxed order for CT of Chest without contrast to [place]: [phone] ?

## 2021-11-23 NOTE — Addendum Note (Signed)
Addended by: Jerl Santos R on: 11/23/2021 02:04 PM ? ? Modules accepted: Orders ? ?

## 2022-03-11 DIAGNOSIS — M47816 Spondylosis without myelopathy or radiculopathy, lumbar region: Secondary | ICD-10-CM | POA: Diagnosis not present

## 2022-03-11 DIAGNOSIS — M4322 Fusion of spine, cervical region: Secondary | ICD-10-CM | POA: Diagnosis not present

## 2022-04-06 ENCOUNTER — Other Ambulatory Visit: Payer: Self-pay | Admitting: Allergy and Immunology

## 2022-04-07 DIAGNOSIS — Z6834 Body mass index (BMI) 34.0-34.9, adult: Secondary | ICD-10-CM | POA: Diagnosis not present

## 2022-04-07 DIAGNOSIS — M47816 Spondylosis without myelopathy or radiculopathy, lumbar region: Secondary | ICD-10-CM | POA: Diagnosis not present

## 2022-04-07 DIAGNOSIS — M545 Low back pain, unspecified: Secondary | ICD-10-CM | POA: Diagnosis not present

## 2022-04-14 ENCOUNTER — Ambulatory Visit: Payer: Managed Care, Other (non HMO) | Admitting: Allergy and Immunology

## 2022-04-14 DIAGNOSIS — J309 Allergic rhinitis, unspecified: Secondary | ICD-10-CM

## 2022-04-15 DIAGNOSIS — M47816 Spondylosis without myelopathy or radiculopathy, lumbar region: Secondary | ICD-10-CM | POA: Diagnosis not present

## 2022-04-15 DIAGNOSIS — M5126 Other intervertebral disc displacement, lumbar region: Secondary | ICD-10-CM | POA: Diagnosis not present

## 2022-04-15 DIAGNOSIS — M48061 Spinal stenosis, lumbar region without neurogenic claudication: Secondary | ICD-10-CM | POA: Diagnosis not present

## 2022-04-22 DIAGNOSIS — M5432 Sciatica, left side: Secondary | ICD-10-CM | POA: Diagnosis not present

## 2022-04-26 ENCOUNTER — Other Ambulatory Visit: Payer: Self-pay | Admitting: Allergy and Immunology

## 2022-04-26 ENCOUNTER — Other Ambulatory Visit: Payer: Self-pay | Admitting: Cardiology

## 2022-04-30 DIAGNOSIS — M5432 Sciatica, left side: Secondary | ICD-10-CM | POA: Diagnosis not present

## 2022-04-30 DIAGNOSIS — M5106 Intervertebral disc disorders with myelopathy, lumbar region: Secondary | ICD-10-CM | POA: Diagnosis not present

## 2022-05-06 ENCOUNTER — Telehealth: Payer: Self-pay | Admitting: Cardiology

## 2022-05-06 NOTE — Telephone Encounter (Signed)
   Pre-operative Risk Assessment    Patient Name: Terry Stanley  DOB: 18-Mar-1961 MRN: [id number]      Request for Surgical Clearance    Procedure:   L5-S1 TLIF  Date of Surgery:  Clearance TBD                                 Surgeon:  Dr. Rennis Harding Surgeon's Group or Practice Name:  [facility] Phone number:  [phone] Fax number:  [phone]   Type of Clearance Requested:   - Medical    Type of Anesthesia:  General    Additional requests/questions:   Please include office note(s), recent lab work, and EKG (needed by pre-admission to avoid repeat exam. Patient has appointment with Dr. Geraldo Pitter in the [place] office on 05/07/22 for a clearance appointment.  Signed, [name]   05/06/2022, 3:15 PM

## 2022-05-07 ENCOUNTER — Ambulatory Visit: Payer: BC Managed Care – PPO | Attending: Cardiology | Admitting: Cardiology

## 2022-05-07 ENCOUNTER — Encounter: Payer: Self-pay | Admitting: Cardiology

## 2022-05-07 VITALS — BP 110/70 | HR 57 | Ht 72.0 in | Wt 245.2 lb

## 2022-05-07 DIAGNOSIS — Z01810 Encounter for preprocedural cardiovascular examination: Secondary | ICD-10-CM

## 2022-05-07 DIAGNOSIS — I2510 Atherosclerotic heart disease of native coronary artery without angina pectoris: Secondary | ICD-10-CM | POA: Diagnosis not present

## 2022-05-07 DIAGNOSIS — E782 Mixed hyperlipidemia: Secondary | ICD-10-CM | POA: Diagnosis not present

## 2022-05-07 DIAGNOSIS — I10 Essential (primary) hypertension: Secondary | ICD-10-CM | POA: Diagnosis not present

## 2022-05-07 HISTORY — DX: Atherosclerotic heart disease of native coronary artery without angina pectoris: I25.10

## 2022-05-07 NOTE — Telephone Encounter (Signed)
Patient was seen in the cardiology office today, per office note "Preop cardiovascular assessment: Patient has significant coronary artery disease by CT coronary angiography in 2021.  Because of orthopedic issues he leads a sedentary lifestyle.  I am inclined to test him with a Lexiscan sestamibi.  For any progression of disease or obstructive coronary artery disease and he is agreeable.  If the test is negative then he is not at high risk for coronary events during the aforementioned surgery.  Meticulous hemodynamic monitoring will further reduce the risk of coronary events."  Final cardiac clearance pending nuclear stress testing on 9/5.

## 2022-05-07 NOTE — Patient Instructions (Signed)
Medication Instructions:  Your physician recommends that you continue on your current medications as directed. Please refer to the Current Medication list given to you today.  *If you need a refill on your cardiac medications before your next appointment, please call your pharmacy*   Lab Work: BMP, CBC, TSH, LFT's,LIPID, HGB A1C, Vitamin D- today If you have labs (blood work) drawn today and your tests are completely normal, you will receive your results only by: [place] (if you have MyChart) OR A paper copy in the mail If you have any lab test that is abnormal or we need to change your treatment, we will call you to review the results.   Testing/Procedures: Your physician has requested that you have a lexiscan myoview. For further information please visit [email or web address]. Please follow instruction sheet, as given.  The test will take approximately 3 to 4 hours to complete; you may bring reading material.  If someone comes with you to your appointment, they will need to remain in the main lobby due to limited space in the testing area. **If you are pregnant or breastfeeding, please notify the nuclear lab prior to your appointment**  How to prepare for your Myocardial Perfusion Test: Do not eat or drink 3 hours prior to your test, except you may have water. Do not consume products containing caffeine (regular or decaffeinated) 12 hours prior to your test. (ex: coffee, chocolate, sodas, tea). Do bring a list of your current medications with you.  If not listed below, you may take your medications as normal. Do wear comfortable clothes (no dresses or overalls) and walking shoes, tennis shoes preferred (No heels or open toe shoes are allowed). Do NOT wear cologne, perfume, aftershave, or lotions (deodorant is allowed). If these instructions are not followed, your test will have to be rescheduled.  Your physician has requested that you have an echocardiogram. Echocardiography is a  painless test that uses sound waves to create images of your heart. It provides your doctor with information about the size and shape of your heart and how well your heart's chambers and valves are working. This procedure takes approximately one hour. There are no restrictions for this procedure.   Follow-Up: At [facility], you and your health needs are our priority.  As part of our continuing mission to provide you with exceptional heart care, we have created designated Provider Care Teams.  These Care Teams include your primary Cardiologist (physician) and Advanced Practice Providers (APPs -  Physician Assistants and Nurse Practitioners) who all work together to provide you with the care you need, when you need it.  We recommend signing up for the patient portal called "MyChart".  Sign up information is provided on this After Visit Summary.  MyChart is used to connect with patients for Virtual Visits (Telemedicine).  Patients are able to view lab/test results, encounter notes, upcoming appointments, etc.  Non-urgent messages can be sent to your provider as well.   To learn more about what you can do with MyChart, go to [email or web address].    Your next appointment:   6 month(s)  The format for your next appointment:   In Person  Provider:   Jyl Heinz, MD   Other Instructions Cardiac Nuclear Scan A cardiac nuclear scan is a test that is done to check the flow of blood to your heart. It is done when you are resting and when you are exercising. The test looks for problems such as: Not enough blood reaching a portion of the  heart. The heart muscle not working as it should. You may need this test if: You have heart disease. You have had lab results that are not normal. You have had heart surgery or a balloon procedure to open up blocked arteries (angioplasty). You have chest pain. You have shortness of breath. In this test, a special dye (tracer) is put into your bloodstream.  The tracer will travel to your heart. A camera will then take pictures of your heart to see how the tracer moves through your heart. This test is usually done at a hospital and takes 2-4 hours. Tell a doctor about: Any allergies you have. All medicines you are taking, including vitamins, herbs, eye drops, creams, and over-the-counter medicines. Any problems you or family members have had with anesthetic medicines. Any blood disorders you have. Any surgeries you have had. Any medical conditions you have. Whether you are pregnant or may be pregnant. What are the risks? Generally, this is a safe test. However, problems may occur, such as: Serious chest pain and heart attack. This is only a risk if the stress portion of the test is done. Rapid heartbeat. A feeling of warmth in your chest. This feeling usually does not last long. Allergic reaction to the tracer. What happens before the test? Ask your doctor about changing or stopping your normal medicines. This is important. Follow instructions from your doctor about what you cannot eat or drink. Remove your jewelry on the day of the test. What happens during the test? An IV tube will be inserted into one of your veins. Your doctor will give you a small amount of tracer through the IV tube. You will wait for 20-40 minutes while the tracer moves through your bloodstream. Your heart will be monitored with an electrocardiogram (ECG). You will lie down on an exam table. Pictures of your heart will be taken for about 15-20 minutes. You may also have a stress test. For this test, one of these things may be done: You will be asked to exercise on a treadmill or a stationary bike. You will be given medicines that will make your heart work harder. This is done if you are unable to exercise. When blood flow to your heart has peaked, a tracer will again be given through the IV tube. After 20-40 minutes, you will get back on the exam table. More pictures  will be taken of your heart. Depending on the tracer that is used, more pictures may need to be taken 3-4 hours later. Your IV tube will be removed when the test is over. The test may vary among doctors and hospitals. What happens after the test? Ask your doctor: Whether you can return to your normal schedule, including diet, activities, and medicines. Whether you should drink more fluids. This will help to remove the tracer from your body. Drink enough fluid to keep your pee (urine) pale yellow. Ask your doctor, or the department that is doing the test: When will my results be ready? How will I get my results? Summary A cardiac nuclear scan is a test that is done to check the flow of blood to your heart. Tell your doctor whether you are pregnant or may be pregnant. Before the test, ask your doctor about changing or stopping your normal medicines. This is important. Ask your doctor whether you can return to your normal activities. You may be asked to drink more fluids. This information is not intended to replace advice given to you by your health  care provider. Make sure you discuss any questions you have with your health care provider. Document Revised: 12/13/2018 Document Reviewed: 02/06/2018 Elsevier Patient Education  2021 [place].    Echocardiogram An echocardiogram is a test that uses sound waves (ultrasound) to produce images of the heart. Images from an echocardiogram can provide important information about: Heart size and shape. The size and thickness and movement of your heart's walls. Heart muscle function and strength. Heart valve function or if you have stenosis. Stenosis is when the heart valves are too narrow. If blood is flowing backward through the heart valves (regurgitation). A tumor or infectious growth around the heart valves. Areas of heart muscle that are not working well because of poor blood flow or injury from a heart attack. Aneurysm detection. An  aneurysm is a weak or damaged part of an artery wall. The wall bulges out from the normal force of blood pumping through the body. Tell a health care provider about: Any allergies you have. All medicines you are taking, including vitamins, herbs, eye drops, creams, and over-the-counter medicines. Any blood disorders you have. Any surgeries you have had. Any medical conditions you have. Whether you are pregnant or may be pregnant. What are the risks? Generally, this is a safe test. However, problems may occur, including an allergic reaction to dye (contrast) that may be used during the test. What happens before the test? No specific preparation is needed. You may eat and drink normally. What happens during the test? You will take off your clothes from the waist up and put on a hospital gown. Electrodes or electrocardiogram (ECG)patches may be placed on your chest. The electrodes or patches are then connected to a device that monitors your heart rate and rhythm. You will lie down on a table for an ultrasound exam. A gel will be applied to your chest to help sound waves pass through your skin. A handheld device, called a transducer, will be pressed against your chest and moved over your heart. The transducer produces sound waves that travel to your heart and bounce back (or "echo" back) to the transducer. These sound waves will be captured in real-time and changed into images of your heart that can be viewed on a video monitor. The images will be recorded on a computer and reviewed by your health care provider. You may be asked to change positions or hold your breath for a short time. This makes it easier to get different views or better views of your heart. In some cases, you may receive contrast through an IV in one of your veins. This can improve the quality of the pictures from your heart. The procedure may vary among health care providers and hospitals.    What can I expect after the  test? You may return to your normal, everyday life, including diet, activities, and medicines, unless your health care provider tells you not to do that. Follow these instructions at home: It is up to you to get the results of your test. Ask your health care provider, or the department that is doing the test, when your results will be ready. Keep all follow-up visits. This is important. Summary An echocardiogram is a test that uses sound waves (ultrasound) to produce images of the heart. Images from an echocardiogram can provide important information about the size and shape of your heart, heart muscle function, heart valve function, and other possible heart problems. You do not need to do anything to prepare before this test.  You may eat and drink normally. After the echocardiogram is completed, you may return to your normal, everyday life, unless your health care provider tells you not to do that. This information is not intended to replace advice given to you by your health care provider. Make sure you discuss any questions you have with your health care provider. Document Revised: 04/15/2020 Document Reviewed: 04/15/2020 Elsevier Patient Education  2021 [employer].

## 2022-05-07 NOTE — Telephone Encounter (Signed)
I will fax these notes to requesting office.

## 2022-05-07 NOTE — Progress Notes (Signed)
Cardiology Office Note:    Date:  05/07/2022   ID:  Terry Stanley, DOB May 09, 1961, MRN [id number]  PCP:  Street, [name], MD  Cardiologist:  Jenean Lindau, MD   Referring MD: [id number] [name], *    ASSESSMENT:    1. Mixed hyperlipidemia   2. Coronary artery disease involving native coronary artery of native heart without angina pectoris   3. Essential hypertension   4. Pre-operative cardiovascular examination    PLAN:    In order of problems listed above:  Coronary artery disease: Secondary prevention stressed with the patient.  Importance of compliance with diet medication stressed any vocalized understanding. Preop cardiovascular assessment: Patient has significant coronary artery disease by CT coronary angiography in 2021.  Because of orthopedic issues he leads a sedentary lifestyle.  I am inclined to test him with a Lexiscan sestamibi.  For any progression of disease or obstructive coronary artery disease and he is agreeable.  If the test is negative then he is not at high risk for coronary events during the aforementioned surgery.  Meticulous hemodynamic monitoring will further reduce the risk of coronary events. Cardiac murmur: Echocardiogram will be done to assess murmur heard on auscultation. Essential hypertension: Blood pressure stable and diet was emphasized. Mixed dyslipidemia: His triglycerides have been markedly elevated.  He wants blood work done today and we will do this for him.  He is fasting.  We will do hemoglobin A1c.  He has history of vitamin D deficiency in the past and in view of upcoming surgery we will check a vitamin D level also. Patient will be seen in follow-up appointment in 6 months or earlier if the patient has any concerns    Medication Adjustments/Labs and Tests Ordered: Current medicines are reviewed at length with the patient today.  Concerns regarding medicines are outlined above.  No orders of the defined types were placed in this  encounter.  No orders of the defined types were placed in this encounter.    Chief Complaint  Patient presents with   Clearance tbd    Back surgery L5-S1 Dr. Patrice Paradise      History of Present Illness:    Terry Stanley is a 61 y.o. male.  Patient has past medical history of coronary artery disease, essential hypertension, dyslipidemia and obesity.  He is planning to undergo back surgery.  For this reason he leads a sedentary lifestyle.  I reviewed CT coronary angiography report from 2021 and he has significant coronary artery disease.  He is here for preop assessment.  He is unknown to me.  He has seen Dr. Bettina Gavia in the past.  At the time of my evaluation, the patient is alert awake oriented and in no distress.  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 2 inhalations every 4-6 hours as needed)   ALPRAZolam (XANAX) 1 MG  tablet Take 0.5-1 mg by mouth daily as needed for anxiety or sleep.   atorvastatin (LIPITOR) 40 MG tablet Take 1 tablet (40 mg total) by mouth daily.   cloNIDine (CATAPRES) 0.1 MG tablet Take 1 tablet (0.1 mg total) by mouth 2 (two) times daily.   Coenzyme Q10 (CO Q 10 PO) Take 1 tablet by mouth at bedtime.   Fluticasone-Salmeterol,sensor, (AIRDUO DIGIHALER) 113-14 MCG/ACT AEPB 1 inhalation 2 times per day (Patient taking differently: Take 1 Inhalation by mouth as needed (wheezing).)   hydrALAZINE (APRESOLINE) 50 MG tablet Take 1 tablet (50 mg total) by mouth 2 (two) times daily.   hydrochlorothiazide (HYDRODIURIL) 12.5 MG tablet TAKE ONE (1) TABLET ONCE DAILY (Patient taking differently: Take 12.5 mg by mouth daily. TAKE ONE (1) TABLET ONCE DAILY)   HYDROcodone-acetaminophen (NORCO) 10-325 MG tablet Take 1 tablet by mouth every 6 (six) hours as needed for moderate pain.   ibuprofen (ADVIL,MOTRIN) 200 MG tablet Take 600 mg by mouth every 6 (six) hours as needed for mild pain or moderate pain.   ipratropium (ATROVENT) 0.06 % nasal spray 2 SPRAYS IN EACH NOSTRIL EVERY 6 HOURS AS NEEDED TO DRY NOSE (Patient taking differently: Place 2 sprays into both nostrils 4 (four) times daily. 2 sprays in each nostril every 6 hours as needed to dry nose)   methocarbamol (ROBAXIN) 750 MG tablet Take 750 mg by mouth every 8 (eight) hours as needed for muscle spasms.   metoprolol tartrate (LOPRESSOR) 25 MG tablet TAKE ONE (1) TABLET BY MOUTH TWO (2) TIMES DAILY (Patient taking differently: Take 25 mg by mouth 2 (two) times daily. TAKE ONE (1) TABLET BY MOUTH TWO (2) TIMES DAILY)   nitroGLYCERIN (NITROSTAT) 0.4 MG SL tablet Place 1 tablet (0.4 mg total) under the tongue every 5 (five) minutes as needed for chest pain.   Olopatadine-Mometasone (RYALTRIS) [id number] MCG/ACT SUSP Place 2 sprays into the nose 2 (two) times daily.   Omega-3 Fatty Acids (FISH OIL) 1000 MG CAPS Take 1 capsule by mouth daily.   omeprazole  (PRILOSEC) 40 MG capsule TAKE 1 CAPSULE BY MOUTH TWICE A DAY (Patient taking differently: Take 40 mg by mouth See admin instructions. TAKE 1 CAPSULE BY MOUTH  up to 2 times day)   pregabalin (LYRICA) 50 MG capsule Take 50 mg by mouth at bedtime.   tamsulosin (FLOMAX) 0.4 MG CAPS capsule Take 0.4 mg by mouth daily.   vitamin C (ASCORBIC ACID) 500 MG tablet Take 500 mg by mouth daily.    [DISCONTINUED] aspirin EC 81 MG tablet Take 81 mg by mouth daily.   [DISCONTINUED] Zinc Acetate, Oral, (ZINC ACETATE PO) Take 1 tablet by mouth daily.     Allergies:   Bee venom and Carvedilol   Social History   Socioeconomic History   Marital status: Married    Spouse name: Not on file   Number of children: Not on file   Years of education: Not on file   Highest education level: Not on file  Occupational History   Not  on file  Tobacco Use   Smoking status: Former    Types: Cigarettes   Smokeless tobacco: Never  Vaping Use   Vaping Use: Never used  Substance and Sexual Activity   Alcohol use: Yes    Alcohol/week: 12.0 standard drinks of alcohol    Types: 12 Cans of beer per week    Comment: a week   Drug use: No   Sexual activity: Not on file  Other Topics Concern   Not on file  Social History Narrative   Not on file   Social Determinants of Health   Financial Resource Strain: Not on file  Food Insecurity: Not on file  Transportation Needs: Not on file  Physical Activity: Not on file  Stress: Not on file  Social Connections: Not on file     Family History: The patient's family history includes Angina in his mother; Hypertension in his brother.  ROS:   Please see the history of present illness.    All other systems reviewed and are negative.  EKGs/Labs/Other Studies Reviewed:    The following studies were reviewed today: I discussed my findings with the patient at length.  EKG reveals sinus rhythm and nonspecific ST-T changes   Recent Labs: No results found for requested  labs within last 365 days.  Recent Lipid Panel    Component Value Date/Time   CHOL 175 02/13/2019 1051   TRIG 197 (H) 02/13/2019 1051   HDL 47 02/13/2019 1051   CHOLHDL 3.7 02/13/2019 1051   LDLCALC 89 02/13/2019 1051    Physical Exam:    VS:  BP 110/70 (BP Location: Left Arm, Patient Position: Sitting)   Pulse (!) 57   Ht 6' (1.829 m)   Wt 245 lb 3.2 oz (111.2 kg)   SpO2 98%   BMI 33.26 kg/m     Wt Readings from Last 3 Encounters:  05/07/22 245 lb 3.2 oz (111.2 kg)  11/18/21 256 lb (116.1 kg)  09/16/21 254 lb 12.8 oz (115.6 kg)     GEN: Patient is in no acute distress HEENT: Normal NECK: No JVD; No carotid bruits LYMPHATICS: No lymphadenopathy CARDIAC: Hear sounds regular, 2/6 systolic murmur at the apex. RESPIRATORY:  Clear to auscultation without rales, wheezing or rhonchi  ABDOMEN: Soft, non-tender, non-distended MUSCULOSKELETAL:  No edema; No deformity  SKIN: Warm and dry NEUROLOGIC:  Alert and oriented x 3 PSYCHIATRIC:  Normal affect   Signed, Jenean Lindau, MD  05/07/2022 8:50 AM    [facility]

## 2022-05-08 LAB — CBC
Hematocrit: 35.8 % — ABNORMAL LOW (ref 37.5–51.0)
Hemoglobin: 12.5 g/dL — ABNORMAL LOW (ref 13.0–17.7)
MCH: 29.7 pg (ref 26.6–33.0)
MCHC: 34.9 g/dL (ref 31.5–35.7)
MCV: 85 fL (ref 79–97)
Platelets: 207 10*3/uL (ref 150–450)
RBC: 4.21 x10E6/uL (ref 4.14–5.80)
RDW: 11.5 % — ABNORMAL LOW (ref 11.6–15.4)
WBC: 5.4 10*3/uL (ref 3.4–10.8)

## 2022-05-08 LAB — LIPID PANEL
Chol/HDL Ratio: 3.9 ratio (ref 0.0–5.0)
Cholesterol, Total: 160 mg/dL (ref 100–199)
HDL: 41 mg/dL (ref >39)
LDL Chol Calc (NIH): 98 mg/dL (ref 0–99)
Triglycerides: 113 mg/dL (ref 0–149)
VLDL Cholesterol Cal: 21 mg/dL (ref 5–40)

## 2022-05-08 LAB — TSH: TSH: 0.737 u[IU]/mL (ref 0.450–4.500)

## 2022-05-08 LAB — HEPATIC FUNCTION PANEL
ALT: 27 IU/L (ref 0–44)
AST: 21 IU/L (ref 0–40)
Albumin: 4.7 g/dL (ref 3.8–4.9)
Alkaline Phosphatase: 77 IU/L (ref 44–121)
Bilirubin Total: 0.6 mg/dL (ref 0.0–1.2)
Bilirubin, Direct: 0.18 mg/dL (ref 0.00–0.40)
Total Protein: 6.9 g/dL (ref 6.0–8.5)

## 2022-05-08 LAB — BASIC METABOLIC PANEL
BUN/Creatinine Ratio: 13 (ref 10–24)
BUN: 16 mg/dL (ref 8–27)
CO2: 25 mmol/L (ref 20–29)
Calcium: 9.8 mg/dL (ref 8.6–10.2)
Chloride: 87 mmol/L — ABNORMAL LOW (ref 96–106)
Creatinine, Ser: 1.26 mg/dL (ref 0.76–1.27)
Glucose: 94 mg/dL (ref 70–99)
Potassium: 4.5 mmol/L (ref 3.5–5.2)
Sodium: 125 mmol/L — ABNORMAL LOW (ref 134–144)
eGFR: 65 mL/min/{1.73_m2} (ref >59)

## 2022-05-08 LAB — VITAMIN D 25 HYDROXY (VIT D DEFICIENCY, FRACTURES): Vit D, 25-Hydroxy: 41.7 ng/mL (ref 30.0–100.0)

## 2022-05-08 LAB — HEMOGLOBIN A1C
Est. average glucose Bld gHb Est-mCnc: 103 mg/dL
Hgb A1c MFr Bld: 5.2 % (ref 4.8–5.6)

## 2022-05-11 ENCOUNTER — Ambulatory Visit: Payer: BC Managed Care – PPO | Attending: Cardiology

## 2022-05-11 DIAGNOSIS — I2510 Atherosclerotic heart disease of native coronary artery without angina pectoris: Secondary | ICD-10-CM | POA: Diagnosis not present

## 2022-05-11 DIAGNOSIS — Z01810 Encounter for preprocedural cardiovascular examination: Secondary | ICD-10-CM

## 2022-05-11 DIAGNOSIS — E782 Mixed hyperlipidemia: Secondary | ICD-10-CM

## 2022-05-11 DIAGNOSIS — I10 Essential (primary) hypertension: Secondary | ICD-10-CM

## 2022-05-11 MED ORDER — REGADENOSON 0.4 MG/5ML IV SOLN
0.4000 mg | Freq: Once | INTRAVENOUS | Status: AC
  Administered 2022-05-11: 0.4 mg via INTRAVENOUS

## 2022-05-11 MED ORDER — TECHNETIUM TC 99M TETROFOSMIN IV KIT
10.3000 | PACK | Freq: Once | INTRAVENOUS | Status: AC | PRN
  Administered 2022-05-11: 10.3 via INTRAVENOUS

## 2022-05-11 MED ORDER — TECHNETIUM TC 99M TETROFOSMIN IV KIT
29.8000 | PACK | Freq: Once | INTRAVENOUS | Status: AC | PRN
  Administered 2022-05-11: 29.8 via INTRAVENOUS

## 2022-05-12 LAB — MYOCARDIAL PERFUSION IMAGING
LV dias vol: 137 mL (ref 62–150)
LV sys vol: 59 mL
Nuc Stress EF: 57 %
Peak HR: 89 {beats}/min
Rest HR: 49 {beats}/min
Rest Nuclear Isotope Dose: 10.3 mCi
SDS: 3
SRS: 1
SSS: 4
Stress Nuclear Isotope Dose: 29.8 mCi
TID: 1.17

## 2022-05-13 NOTE — Telephone Encounter (Signed)
   Primary Cardiologist: Shirlee More, MD  Chart reviewed as part of pre-operative protocol coverage. Given past medical history and time since last visit, based on ACC/AHA guidelines, Terry Stanley would be at acceptable risk for the planned procedure. He underwent nuclear stress testing and it did not reveal concern for ischemia.  I will route this recommendation to the requesting party via Epic fax function and remove from pre-op pool.  Please call with questions.  Emmaline Life, NP-C     05/13/2022, 2:26 PM [address] Office [phone] Fax [phone]

## 2022-05-16 ENCOUNTER — Other Ambulatory Visit: Payer: Self-pay | Admitting: Cardiology

## 2022-05-17 ENCOUNTER — Other Ambulatory Visit: Payer: Self-pay | Admitting: Cardiology

## 2022-05-17 ENCOUNTER — Other Ambulatory Visit: Payer: Self-pay

## 2022-05-17 ENCOUNTER — Telehealth: Payer: Self-pay | Admitting: Cardiology

## 2022-05-17 NOTE — Telephone Encounter (Signed)
Patient needs refill on Benicar 40 mg sent to [facility] on [address].  Patient saw Dr. Geraldo Pitter on 05/07/22 and is scheduled for a follow up with Dr. Bettina Gavia in March 2024.

## 2022-05-18 ENCOUNTER — Other Ambulatory Visit: Payer: Self-pay

## 2022-05-18 DIAGNOSIS — N411 Chronic prostatitis: Secondary | ICD-10-CM | POA: Diagnosis not present

## 2022-05-18 DIAGNOSIS — N138 Other obstructive and reflux uropathy: Secondary | ICD-10-CM | POA: Diagnosis not present

## 2022-05-18 DIAGNOSIS — M47819 Spondylosis without myelopathy or radiculopathy, site unspecified: Secondary | ICD-10-CM | POA: Diagnosis not present

## 2022-05-18 DIAGNOSIS — E871 Hypo-osmolality and hyponatremia: Secondary | ICD-10-CM | POA: Diagnosis not present

## 2022-05-18 DIAGNOSIS — N401 Enlarged prostate with lower urinary tract symptoms: Secondary | ICD-10-CM | POA: Diagnosis not present

## 2022-05-18 NOTE — Telephone Encounter (Signed)
Pt calling back stating he has been taking benicar for years and he was unaware it was taken off his medication list. Please advise.

## 2022-05-19 ENCOUNTER — Ambulatory Visit: Payer: BC Managed Care – PPO | Attending: Cardiology

## 2022-05-19 ENCOUNTER — Other Ambulatory Visit: Payer: Self-pay

## 2022-05-19 DIAGNOSIS — I10 Essential (primary) hypertension: Secondary | ICD-10-CM | POA: Diagnosis not present

## 2022-05-19 DIAGNOSIS — I2510 Atherosclerotic heart disease of native coronary artery without angina pectoris: Secondary | ICD-10-CM

## 2022-05-19 DIAGNOSIS — I517 Cardiomegaly: Secondary | ICD-10-CM

## 2022-05-19 DIAGNOSIS — E782 Mixed hyperlipidemia: Secondary | ICD-10-CM | POA: Diagnosis not present

## 2022-05-19 DIAGNOSIS — Z01810 Encounter for preprocedural cardiovascular examination: Secondary | ICD-10-CM | POA: Diagnosis not present

## 2022-05-19 DIAGNOSIS — I080 Rheumatic disorders of both mitral and aortic valves: Secondary | ICD-10-CM | POA: Diagnosis not present

## 2022-05-19 LAB — ECHOCARDIOGRAM COMPLETE
Area-P 1/2: 3.99 cm2
MV M vel: 5.24 m/s
MV Peak grad: 109.8 mmHg
S' Lateral: 3.3 cm

## 2022-05-19 MED ORDER — OLMESARTAN MEDOXOMIL 40 MG PO TABS: 40.0000 mg | ORAL_TABLET | Freq: Every day | ORAL | 3 refills | Status: DC

## 2022-05-19 NOTE — Telephone Encounter (Signed)
Patient had an echo today in the office and asked about his Benicar dose. After researching what his dose was Dr. Bettina Gavia agreed to renew his Benicar at 40 mg daily. An order was placed and sent to his pharmacy via [place]. Patient had no further questions at this time.

## 2022-05-20 DIAGNOSIS — R0981 Nasal congestion: Secondary | ICD-10-CM | POA: Diagnosis not present

## 2022-05-20 DIAGNOSIS — R051 Acute cough: Secondary | ICD-10-CM | POA: Diagnosis not present

## 2022-05-20 DIAGNOSIS — J019 Acute sinusitis, unspecified: Secondary | ICD-10-CM | POA: Diagnosis not present

## 2022-05-26 DIAGNOSIS — E781 Pure hyperglyceridemia: Secondary | ICD-10-CM | POA: Diagnosis not present

## 2022-05-27 DIAGNOSIS — M5432 Sciatica, left side: Secondary | ICD-10-CM | POA: Diagnosis not present

## 2022-05-27 DIAGNOSIS — M4726 Other spondylosis with radiculopathy, lumbar region: Secondary | ICD-10-CM | POA: Diagnosis not present

## 2022-06-02 DIAGNOSIS — M5432 Sciatica, left side: Secondary | ICD-10-CM

## 2022-06-02 HISTORY — DX: Sciatica, left side: M54.32

## 2022-06-19 DIAGNOSIS — J0100 Acute maxillary sinusitis, unspecified: Secondary | ICD-10-CM | POA: Diagnosis not present

## 2022-06-24 DIAGNOSIS — J301 Allergic rhinitis due to pollen: Secondary | ICD-10-CM | POA: Diagnosis not present

## 2022-06-24 DIAGNOSIS — Z6832 Body mass index (BMI) 32.0-32.9, adult: Secondary | ICD-10-CM | POA: Diagnosis not present

## 2022-06-24 DIAGNOSIS — H66002 Acute suppurative otitis media without spontaneous rupture of ear drum, left ear: Secondary | ICD-10-CM | POA: Diagnosis not present

## 2022-06-25 DIAGNOSIS — Z01818 Encounter for other preprocedural examination: Secondary | ICD-10-CM | POA: Diagnosis not present

## 2022-06-25 DIAGNOSIS — M5432 Sciatica, left side: Secondary | ICD-10-CM | POA: Diagnosis not present

## 2022-06-25 DIAGNOSIS — M47816 Spondylosis without myelopathy or radiculopathy, lumbar region: Secondary | ICD-10-CM | POA: Diagnosis not present

## 2022-07-05 DIAGNOSIS — E785 Hyperlipidemia, unspecified: Secondary | ICD-10-CM | POA: Diagnosis not present

## 2022-07-05 DIAGNOSIS — M5117 Intervertebral disc disorders with radiculopathy, lumbosacral region: Secondary | ICD-10-CM | POA: Diagnosis not present

## 2022-07-05 DIAGNOSIS — M4726 Other spondylosis with radiculopathy, lumbar region: Secondary | ICD-10-CM | POA: Diagnosis not present

## 2022-07-05 DIAGNOSIS — K219 Gastro-esophageal reflux disease without esophagitis: Secondary | ICD-10-CM | POA: Diagnosis not present

## 2022-07-05 DIAGNOSIS — M5432 Sciatica, left side: Secondary | ICD-10-CM | POA: Diagnosis not present

## 2022-07-05 DIAGNOSIS — M5106 Intervertebral disc disorders with myelopathy, lumbar region: Secondary | ICD-10-CM | POA: Diagnosis not present

## 2022-07-05 DIAGNOSIS — I700 Atherosclerosis of aorta: Secondary | ICD-10-CM | POA: Diagnosis not present

## 2022-07-05 DIAGNOSIS — Z7951 Long term (current) use of inhaled steroids: Secondary | ICD-10-CM | POA: Diagnosis not present

## 2022-07-05 DIAGNOSIS — M5137 Other intervertebral disc degeneration, lumbosacral region: Secondary | ICD-10-CM | POA: Diagnosis not present

## 2022-07-05 DIAGNOSIS — J45909 Unspecified asthma, uncomplicated: Secondary | ICD-10-CM | POA: Diagnosis not present

## 2022-07-05 DIAGNOSIS — M48061 Spinal stenosis, lumbar region without neurogenic claudication: Secondary | ICD-10-CM | POA: Diagnosis not present

## 2022-07-05 DIAGNOSIS — Z87891 Personal history of nicotine dependence: Secondary | ICD-10-CM | POA: Diagnosis not present

## 2022-07-05 DIAGNOSIS — I10 Essential (primary) hypertension: Secondary | ICD-10-CM | POA: Diagnosis not present

## 2022-07-05 DIAGNOSIS — Z0389 Encounter for observation for other suspected diseases and conditions ruled out: Secondary | ICD-10-CM | POA: Diagnosis not present

## 2022-07-05 DIAGNOSIS — F418 Other specified anxiety disorders: Secondary | ICD-10-CM | POA: Diagnosis not present

## 2022-07-05 DIAGNOSIS — M48062 Spinal stenosis, lumbar region with neurogenic claudication: Secondary | ICD-10-CM | POA: Diagnosis not present

## 2022-07-05 DIAGNOSIS — Z79899 Other long term (current) drug therapy: Secondary | ICD-10-CM | POA: Diagnosis not present

## 2022-07-05 DIAGNOSIS — M5416 Radiculopathy, lumbar region: Secondary | ICD-10-CM | POA: Diagnosis not present

## 2022-07-05 DIAGNOSIS — N400 Enlarged prostate without lower urinary tract symptoms: Secondary | ICD-10-CM | POA: Diagnosis not present

## 2022-07-05 DIAGNOSIS — M4326 Fusion of spine, lumbar region: Secondary | ICD-10-CM | POA: Diagnosis not present

## 2022-07-05 HISTORY — PX: BACK SURGERY: SHX140

## 2022-07-06 DIAGNOSIS — M48061 Spinal stenosis, lumbar region without neurogenic claudication: Secondary | ICD-10-CM | POA: Diagnosis not present

## 2022-07-06 DIAGNOSIS — Z7951 Long term (current) use of inhaled steroids: Secondary | ICD-10-CM | POA: Diagnosis not present

## 2022-07-06 DIAGNOSIS — I10 Essential (primary) hypertension: Secondary | ICD-10-CM | POA: Diagnosis not present

## 2022-07-06 DIAGNOSIS — M5137 Other intervertebral disc degeneration, lumbosacral region: Secondary | ICD-10-CM | POA: Diagnosis not present

## 2022-07-06 DIAGNOSIS — I700 Atherosclerosis of aorta: Secondary | ICD-10-CM | POA: Diagnosis not present

## 2022-07-06 DIAGNOSIS — M4726 Other spondylosis with radiculopathy, lumbar region: Secondary | ICD-10-CM | POA: Diagnosis not present

## 2022-07-06 DIAGNOSIS — Z87891 Personal history of nicotine dependence: Secondary | ICD-10-CM | POA: Diagnosis not present

## 2022-07-06 DIAGNOSIS — E785 Hyperlipidemia, unspecified: Secondary | ICD-10-CM | POA: Diagnosis not present

## 2022-07-06 DIAGNOSIS — F418 Other specified anxiety disorders: Secondary | ICD-10-CM | POA: Diagnosis not present

## 2022-07-06 DIAGNOSIS — Z79899 Other long term (current) drug therapy: Secondary | ICD-10-CM | POA: Diagnosis not present

## 2022-07-06 DIAGNOSIS — N400 Enlarged prostate without lower urinary tract symptoms: Secondary | ICD-10-CM | POA: Diagnosis not present

## 2022-07-06 DIAGNOSIS — Z981 Arthrodesis status: Secondary | ICD-10-CM | POA: Diagnosis not present

## 2022-07-06 DIAGNOSIS — K219 Gastro-esophageal reflux disease without esophagitis: Secondary | ICD-10-CM | POA: Diagnosis not present

## 2022-07-06 DIAGNOSIS — J45909 Unspecified asthma, uncomplicated: Secondary | ICD-10-CM | POA: Diagnosis not present

## 2022-07-19 ENCOUNTER — Telehealth: Payer: Self-pay | Admitting: Cardiology

## 2022-07-19 NOTE — Telephone Encounter (Signed)
I d/w the pre op provider Nicholes Rough, [facility] who states this will need to be addressed by Dr. Bettina Gavia to see if he feels the pt is still cleared. I will send this phone note to Dr. Joya Gaskins to d/w MD.

## 2022-07-19 NOTE — Telephone Encounter (Signed)
Pt states he is supposed to having back surgery soon (phone note 05/06/22). Pt states he has been feeling very weak and fatigue and [facility] suggested he call our office to make sure he is still okay for surgery and possibly have more labs drawn.

## 2022-07-20 DIAGNOSIS — M545 Low back pain, unspecified: Secondary | ICD-10-CM | POA: Diagnosis not present

## 2022-07-20 DIAGNOSIS — I951 Orthostatic hypotension: Secondary | ICD-10-CM | POA: Diagnosis not present

## 2022-07-20 DIAGNOSIS — D649 Anemia, unspecified: Secondary | ICD-10-CM | POA: Diagnosis not present

## 2022-07-20 DIAGNOSIS — M47819 Spondylosis without myelopathy or radiculopathy, site unspecified: Secondary | ICD-10-CM | POA: Diagnosis not present

## 2022-07-21 NOTE — Telephone Encounter (Signed)
Pt saw his PCP for same.

## 2022-08-05 DIAGNOSIS — M4726 Other spondylosis with radiculopathy, lumbar region: Secondary | ICD-10-CM | POA: Diagnosis not present

## 2022-08-25 ENCOUNTER — Other Ambulatory Visit: Payer: Self-pay | Admitting: Cardiology

## 2022-08-25 DIAGNOSIS — I1A0 Resistant hypertension: Secondary | ICD-10-CM

## 2022-08-25 NOTE — Telephone Encounter (Signed)
Refills sent to pharmacy. 

## 2022-08-27 DIAGNOSIS — Z79899 Other long term (current) drug therapy: Secondary | ICD-10-CM | POA: Diagnosis not present

## 2022-08-27 DIAGNOSIS — G894 Chronic pain syndrome: Secondary | ICD-10-CM | POA: Diagnosis not present

## 2022-08-27 DIAGNOSIS — Z79891 Long term (current) use of opiate analgesic: Secondary | ICD-10-CM | POA: Diagnosis not present

## 2022-09-29 DIAGNOSIS — M4326 Fusion of spine, lumbar region: Secondary | ICD-10-CM | POA: Diagnosis not present

## 2022-09-30 ENCOUNTER — Other Ambulatory Visit: Payer: Self-pay | Admitting: Allergy and Immunology

## 2022-10-14 DIAGNOSIS — M47812 Spondylosis without myelopathy or radiculopathy, cervical region: Secondary | ICD-10-CM | POA: Diagnosis not present

## 2022-10-14 DIAGNOSIS — M4326 Fusion of spine, lumbar region: Secondary | ICD-10-CM | POA: Diagnosis not present

## 2022-10-14 DIAGNOSIS — M4322 Fusion of spine, cervical region: Secondary | ICD-10-CM | POA: Diagnosis not present

## 2022-10-14 DIAGNOSIS — M7918 Myalgia, other site: Secondary | ICD-10-CM | POA: Diagnosis not present

## 2022-10-19 DIAGNOSIS — M47812 Spondylosis without myelopathy or radiculopathy, cervical region: Secondary | ICD-10-CM | POA: Diagnosis not present

## 2022-10-19 DIAGNOSIS — M542 Cervicalgia: Secondary | ICD-10-CM | POA: Diagnosis not present

## 2022-10-23 ENCOUNTER — Other Ambulatory Visit: Payer: Self-pay | Admitting: Cardiology

## 2022-10-25 NOTE — Telephone Encounter (Signed)
[facility]  [id number] [place]

## 2022-11-04 NOTE — Progress Notes (Signed)
Cardiology Office Note:    Date:  11/05/2022   ID:  Terry Stanley, DOB 01-May-1961, MRN [id number]  PCP:  Street, [name], MD  Cardiologist:  Shirlee More, MD    Referring MD: [address], [name], *    ASSESSMENT:    1. Coronary artery disease involving native coronary artery of native heart without angina pectoris   2. Resistant hypertension   3. Mixed hyperlipidemia    PLAN:    In order of problems listed above:  Doing well having no anginal discomfort continue medical therapy including beta-blocker lipid-lowering with high intensity statin. Much improved since surgery with weight loss and activity resolution of pain he needs to start to record and bring to the office with him continues regimen including beta-blocker ARB centrally active clonidine thiazide diuretic and hydralazine.   Continue his statin check lipid profile today   Next appointment: 6 months   Medication Adjustments/Labs and Tests Ordered: Current medicines are reviewed at length with the patient today.  Concerns regarding medicines are outlined above.  No orders of the defined types were placed in this encounter.  No orders of the defined types were placed in this encounter.   Chief Complaint  Patient presents with   Follow-up  CAD and hypertension  History of Present Illness:    Terry Stanley is a 62 y.o. male with a hx of elevated coronary calcium score 88 percentile and moderate CAD 50 to 69% LAD 20 to 49% ostial left circumflex 20 to 49% marginal stenosis resistant hypertension and hyper lipidemia last seen 11/18/2021.  Prior to orthopedic surgery he had an echocardiogram performe 05/19/2022 left ventricle normal in size EF 60 to 65% normal right ventricular size and function mild left atrial enlargement mild mitral regurgitation. He also had a myocardial perfusion study reported 05/11/2022 perfusion was normal function normal EF 57% low risk test.  compliance with diet, lifestyle and  medications: Yes  Is remarkably improved after back surgery more functional walks over 8000 steps His weight is down in the range of 20 pounds and his blood pressure is at target.  He checks occasionally at home and runs 140/80 and I asked him with with good technique validated device check his blood pressure daily record and bring to visits in the future. Reviewed the results of his echo and perfusion study with him He tolerates his statin without muscle pain or weakness Most recent lipid profile with a LDL 98 September 2023 Past Medical History:  Diagnosis Date   Asthma 05/26/2017   Cervical radiculopathy due to degenerative joint disease of spine 06/12/2019   Cervical radiculopathy due to degenerative joint disease of spine 06/12/2019   Edema 04/07/2018   Elevated coronary artery calcium score 08/01/2019   Essential hypertension 08/23/2016   Hyperlipidemia 10/15/2011   Low back pain 10/15/2011   Neoplasm of skin of upper limb or shoulder, benign 01/30/2016   PONV (postoperative nausea and vomiting) 05/26/2017   Resistant hypertension 08/23/2016   Spinal stenosis of lumbar region 10/15/2011   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   atorvastatin (LIPITOR) 40 MG tablet Take 1 tablet (40 mg total) by mouth daily.   cloNIDine (CATAPRES) 0.1 MG tablet TAKE 1 TABLET BY MOUTH 2 TIMES DAILY.   Coenzyme Q10 (CO Q 10 PO) Take 1 tablet by mouth at bedtime.    Fluticasone-Salmeterol,sensor, (AIRDUO DIGIHALER) 113-14 MCG/ACT AEPB 1 inhalation 2 times per day   hydrALAZINE (APRESOLINE) 50 MG tablet Take 1 tablet (50 mg total) by mouth 2 (two) times daily.   hydrochlorothiazide (HYDRODIURIL) 12.5 MG tablet TAKE 1 TABLET EVERY DAY   HYDROcodone-acetaminophen (NORCO) 10-325 MG tablet Take 1 tablet by mouth every 6 (six) hours as needed for moderate pain.   ibuprofen (ADVIL,MOTRIN) 200 MG tablet Take 600 mg by mouth every 6 (six) hours as needed for mild pain or moderate pain.   ipratropium (ATROVENT) 0.06 % nasal spray 2 SPRAYS IN EACH NOSTRIL EVERY 6 HOURS AS NEEDED TO DRY NOSE   methocarbamol (ROBAXIN) 750 MG tablet Take 750 mg by mouth every 8 (eight) hours as needed for muscle spasms.   metoprolol tartrate (LOPRESSOR) 25 MG tablet TAKE ONE (1) TABLET BY MOUTH TWO (2) TIMES DAILY   nitroGLYCERIN (NITROSTAT) 0.4 MG SL tablet Place 1 tablet (0.4 mg total) under the tongue every 5 (five) minutes as needed for chest pain.   olmesartan (BENICAR) 40 MG tablet Take 1 tablet (40 mg total) by mouth daily.   Olopatadine-Mometasone (RYALTRIS) [id number] MCG/ACT SUSP Place 2 sprays into the nose 2 (two) times daily.   Omega-3 Fatty Acids (FISH OIL) 1000 MG CAPS Take 1 capsule by mouth daily.   omeprazole (PRILOSEC) 40 MG capsule TAKE 1 CAPSULE BY MOUTH TWICE A DAY   tamsulosin (FLOMAX) 0.4 MG CAPS capsule Take 0.4 mg by mouth daily.   vitamin C (ASCORBIC ACID) 500 MG tablet Take 500 mg by mouth daily.      Allergies:   Bee venom and Carvedilol   Social History   Socioeconomic History   Marital status: Married    Spouse name: Not on file   Number of children: Not on file   Years of education: Not on file   Highest education level: Not on file  Occupational History   Not on file  Tobacco Use   Smoking status: Former    Types: Cigarettes   Smokeless tobacco: Never  Vaping Use   Vaping Use: Never used  Substance and Sexual Activity   Alcohol use: Yes     Alcohol/week: 12.0 standard drinks of alcohol    Types: 12 Cans of beer per week    Comment: a week   Drug use: No   Sexual activity: Not on file  Other Topics Concern   Not on file  Social History Narrative   Not on file   Social Determinants of Health   Financial Resource Strain: Not on file  Food Insecurity: Not on file  Transportation Needs: Not on file  Physical Activity: Not on file  Stress: Not on file  Social Connections: Not on file     Family History: The patient's family history includes Angina in his mother; Hypertension in his brother. ROS:   Please see the history of present illness.    All other systems reviewed and are negative.  EKGs/Labs/Other Studies Reviewed:    The following studies were reviewed today:  Cardiac  Studies & Procedures     STRESS TESTS  MYOCARDIAL PERFUSION IMAGING 05/12/2022  Narrative   The study is normal. The study is low risk.   Left ventricular function is normal. Nuclear stress EF: 57 %. The left ventricular ejection fraction is normal (55-65%). End diastolic cavity size is normal.   Prior study not available for comparison.   ECHOCARDIOGRAM  ECHOCARDIOGRAM COMPLETE 05/19/2022  Narrative ECHOCARDIOGRAM REPORT    Patient Name:   JAMION REAGAN Date of Exam: 05/19/2022 Medical Rec #:  [id number]     Height:       72.0 in Accession #:    [id number]    Weight:       245.0 lb Date of Birth:  [birth year]     BSA:          2.322 m Patient Age:    4 years      BP:           110/70 mmHg Patient Gender: M             HR:           58 bpm. Exam Location:  [place]  Procedure: 2D Echo, Cardiac Doppler, Color Doppler and Strain Analysis  Indications:    Coronary artery disease involving native coronary artery of native heart without angina pectoris [I25.10 (ICD-10-CM)]; Mixed hyperlipidemia [E78.2 (ICD-10-CM)]; Essential hypertension [I10 (ICD-10-CM)]; Pre-operative cardiovascular examination [Z01.810 (ICD-10-CM)]  History:         Patient has no prior history of Echocardiogram examinations. CAD; Risk Factors:Hypertension and Dyslipidemia.  Sonographer:    Philipp Deputy RDCS Referring Phys: [place]   1. Left ventricular ejection fraction, by estimation, is 60 to 65%. The left ventricle has normal function. The left ventricle has no regional wall motion abnormalities. Left ventricular diastolic parameters were normal. 2. Right ventricular systolic function is normal. The right ventricular size is normal. 3. Left atrial size was mildly dilated. 4. The mitral valve is normal in structure. Mild mitral valve regurgitation. No evidence of mitral stenosis. 5. The aortic valve is normal in structure. Aortic valve regurgitation is not visualized. Aortic valve sclerosis is present, with no evidence of aortic valve stenosis. 6. The inferior vena cava is normal in size with greater than 50% respiratory variability, suggesting right atrial pressure of 3 mmHg.  FINDINGS Left Ventricle: Left ventricular ejection fraction, by estimation, is 60 to 65%. The left ventricle has normal function. The left ventricle has no regional wall motion abnormalities. The left ventricular internal cavity size was normal in size. There is no left ventricular hypertrophy. Left ventricular diastolic parameters were normal.  Right Ventricle: The right ventricular size is normal. No increase in right ventricular wall thickness. Right ventricular systolic function is normal.  Left Atrium: Left atrial size was mildly dilated.  Right Atrium: Right atrial size was normal in size.  Pericardium: There is no evidence of pericardial effusion.  Mitral Valve: The mitral valve is normal in structure. Mild mitral valve regurgitation. No evidence of mitral valve stenosis.  Tricuspid Valve: The tricuspid valve is normal in structure. Tricuspid valve regurgitation is not demonstrated. No evidence of tricuspid stenosis.  Aortic  Valve: The aortic valve is normal in structure. Aortic valve regurgitation is not visualized. Aortic valve sclerosis is present, with no evidence of aortic valve stenosis.  Pulmonic Valve: The pulmonic valve was normal in structure. Pulmonic valve regurgitation is not visualized. No evidence of pulmonic stenosis.  Aorta: The aortic root is normal  in size and structure.  Venous: The inferior vena cava is normal in size with greater than 50% respiratory variability, suggesting right atrial pressure of 3 mmHg.  IAS/Shunts: No atrial level shunt detected by color flow Doppler.   LEFT VENTRICLE PLAX 2D LVIDd:         5.20 cm   Diastology LVIDs:         3.30 cm   LV e' medial:    7.29 cm/s LV PW:         1.10 cm   LV E/e' medial:  9.6 LV IVS:        1.40 cm   LV e' lateral:   9.25 cm/s LVOT diam:     2.20 cm   LV E/e' lateral: 7.6 LV SV:         104 LV SV Index:   45 LVOT Area:     3.80 cm   RIGHT VENTRICLE             IVC RV Basal diam:  3.00 cm     IVC diam: 2.00 cm RV S prime:     12.50 cm/s TAPSE (M-mode): 2.6 cm  LEFT ATRIUM             Index        RIGHT ATRIUM           Index LA diam:        4.80 cm 2.07 cm/m   RA Area:     19.20 cm LA Vol (A2C):   93.5 ml 40.26 ml/m  RA Volume:   53.30 ml  22.95 ml/m LA Vol (A4C):   66.2 ml 28.50 ml/m LA Biplane Vol: 79.6 ml 34.27 ml/m AORTIC VALVE LVOT Vmax:   96.60 cm/s LVOT Vmean:  71.300 cm/s LVOT VTI:    0.273 m  AORTA Ao Root diam: 3.30 cm Ao Asc diam:  3.50 cm Ao Desc diam: 2.40 cm  MITRAL VALVE MV Area (PHT): 3.99 cm    SHUNTS MV Decel Time: 190 msec    Systemic VTI:  0.27 m MR Peak grad: 109.8 mmHg   Systemic Diam: 2.20 cm MR Vmax:      524.00 cm/s MV E velocity: 70.30 cm/s MV A velocity: 55.70 cm/s MV E/A ratio:  1.26  Jenne Campus MD Electronically signed by Jenne Campus MD Signature Date/Time: 05/19/2022/12:21:26 PM    Final     CT SCANS  CT CORONARY MORPH W/CTA COR W/SCORE  11/14/2019  Addendum 11/14/2019  7:44 PM ADDENDUM REPORT: 11/14/2019 19:42  HISTORY: 62 yo male CAD eval, high risk, asymptomatic, prior calcium score 100-400  EXAM: Cardiac/Coronary CTA  TECHNIQUE: The patient was scanned on a [employer].  PROTOCOL: A 120 kV prospective scan was triggered in the descending thoracic aorta at 111 HU's. Axial non-contrast 3 mm slices were carried out through the heart. The data set was analyzed on a dedicated work station and scored using the [place]. Gantry rotation speed was 250 msecs and collimation was .6 mm. Beta blockade and 0.8 mg of sl NTG was given. The 3D data set was reconstructed in 5% intervals of the 67-82 % of the R-R cycle. Diastolic phases were analyzed on a dedicated work station using MPR, MIP and VRT modes. The patient received 28m OMNIPAQUE IOHEXOL 350 MG/ML SOLN of contrast.  FINDINGS: Quality: Excellent (HR 52)  Coronary calcium score: The patient's coronary artery calcium score is 344, which places the patient in the 89th percentile.  Coronary arteries: Normal coronary origins.  Right dominance.  Right Coronary Artery: Dominant vessel. Gives of the R-PDA and R-PLB branches. Minimal 1-24% proximal mixed stenosis (CADRADS1). There is moderate diffuse mixed 50-69% (CADRADS3) mid vessel stenosis that is not likely flow-limiting. Minimal mixed proximal R-PLB branch stenosis (CADRADS1).  Left Main Coronary Artery: Normal left main. Bifurcates into the LAD and LCx arteries.  Left Anterior Descending Coronary Artery: Mild mixed 25-49% ostial LAD stenosis (CADRADS 2). Proximal minimal 1-24% mixed stenosis (CADRADS1) with mild non-calcified diffuse distal disease. There are 2 smaller proximal diagonal vessels without stenosis.  Left Circumflex Artery: Minimal 1-24% mixed proximal stenosis (CADRADS1). The artery trifurcates, giving off a moderate-sized OM branch with mild 25-49% disease (CADRADS2) and a  smaller OM2 branch without disease.  Aorta: Normal size, 33 mm at the mid ascending aorta (level of the PA bifurcation) measured double oblique. Aortic atherosclerosis. No dissection.  Aortic Valve: Tricuspid.  No calcifications.  Other findings:  Normal pulmonary vein drainage into the left atrium.  Normal left atrial appendage without a thrombus.  Dilated main pulmonary artery at 30 mm, suggestive of pulmonary hypertension.  IMPRESSION: 1. Mild to moderate multivessel mixed CAD, worse in the RCA, CADRADS = 3, likely non-obstructive. CT FFR will be performed and reported separately.  2. Coronary calcium score of 344. This was 89th percentile for age and sex matched control. Multivessel CAC noted.  3. Normal coronary origin with right dominance.  4.  Dilated main PA at 30 mm, suggestive of pulmonary hypertension.  5. Aggressive cardiovascular risk factor modification is encouraged.   Electronically Signed By: Pixie Casino M.D. On: 11/14/2019 19:42  Narrative EXAM: OVER-READ INTERPRETATION  CT CHEST  The following report is an over-read performed by radiologist Dr. Rolm Baptise of [facility], [place] on 11/14/2019. This over-read does not include interpretation of cardiac or coronary anatomy or pathology. The coronary CTA interpretation by the cardiologist is attached.  COMPARISON:  None.  FINDINGS: Vascular: Heart is normal size.  Visualized aorta normal caliber.  Mediastinum/Nodes: No adenopathy in the lower mediastinum or hila.  Lungs/Pleura: Small calcified granulomas in the right lung. Noncalcified small peripheral nodules in the right lower lobe, the largest 6-7 mm on image 17. Adjacent smaller nodule peripherally on image 22. No confluent opacities or effusions.  Upper Abdomen: Imaging into the upper abdomen shows no acute findings.  Musculoskeletal: Chest wall soft tissues are unremarkable. No acute bony abnormality.  IMPRESSION: Old  granulomatous disease.  Small subpleural nodules in the right lower lobe posteriorly, the largest 6-7 mm. Non-contrast chest CT at 3-6 months is recommended. If the nodules are stable at time of repeat CT, then future CT at 18-24 months (from today's scan) is considered optional for low-risk patients, but is recommended for high-risk patients. This recommendation follows the consensus statement: Guidelines for Management of Incidental Pulmonary Nodules Detected on CT Images: From the Fleischner Society 2017; Radiology 2017; 284:228-243.  Electronically Signed: By: Rolm Baptise M.D. On: 11/14/2019 08:39   CT SCANS  CT CARDIAC SCORING (SELF PAY ONLY) 07/26/2019  Addendum 07/26/2019  4:05 PM ADDENDUM REPORT: 07/26/2019 15:36  ADDENDUM: OVER-READ INTERPRETATION  CT CHEST  The following report is an over-read performed by radiologist Dr. Forest Gleason [facility] on 07/26/2019. This over-read does not include interpretation of cardiac or coronary anatomy or pathology. The calcium score interpretation by the cardiologist is attached.  COMPARISON:  None.  FINDINGS:  Vascular: Aortic atherosclerosis.  Mediastinum/Nodes: No imaged thoracic adenopathy.  Lungs/Pleura: No  imaged pleural fluid. Mild centrilobular emphysema. Tiny well-circumscribed pulmonary nodules, the majority of which are felt to be calcified bilaterally. A right lower lobe subpleural pulmonary nodule of 4 mm on 30/3 is not calcified.  Upper Abdomen: Normal imaged portions of the liver, stomach.  Musculoskeletal: No acute osseous abnormality. Lower thoracic spondylosis.  IMPRESSION:  1.  No acute findings in the imaged extracardiac chest. 2.  Emphysema (ICD10-J43.9). 3. Multiple pulmonary nodules, primarily calcified granulomas. Right lower lobe pulmonary nodule of 4 mm is not calcified. Non-contrast chest CT can be considered in 12 months, given risk factors for primary bronchogenic  carcinoma. This recommendation follows the consensus statement: Guidelines for Management of Incidental Pulmonary Nodules Detected on CT Images: From the Fleischner Society 2017; Radiology 2017; 284:228-243.   Electronically Signed By: Abigail Miyamoto M.D. On: 07/26/2019 15:36  Narrative CLINICAL DATA:  Risk stratification  EXAM: Coronary Calcium Score  TECHNIQUE: The patient was scanned on a [employer] scanner. Axial non-contrast 3 mm slices were carried out through the heart. The data set was analyzed on a dedicated work station and scored using the [place].  FINDINGS: Non-cardiac: See separate report from [facility].  Ascending Aorta: Normal Caliber.  Calcifications noted.  Pericardium: Normal  Coronary arteries: Normal coronary origins. Coronary calcifications in the LAD, RCA and LCx.  IMPRESSION: Coronary calcium score of 300. This was 88th percentile for age and sex matched control.  Fransico Him  Electronically Signed: By: Fransico Him On: 07/26/2019 10:53           Recent Labs: 05/07/2022: ALT 27; BUN 16; Creatinine, Ser 1.26; Hemoglobin 12.5; Platelets 207; Potassium 4.5; Sodium 125; TSH 0.737  Recent Lipid Panel    Component Value Date/Time   CHOL 160 05/07/2022 0904   TRIG 113 05/07/2022 0904   HDL 41 05/07/2022 0904   CHOLHDL 3.9 05/07/2022 0904   LDLCALC 98 05/07/2022 0904    Physical Exam:    VS:  BP 130/80 (BP Location: Left Arm, Patient Position: Sitting, Cuff Size: Normal)   Pulse 64   Ht 6' (1.829 m)   Wt 242 lb (109.8 kg)   SpO2 97%   BMI 32.82 kg/m     Wt Readings from Last 3 Encounters:  11/05/22 242 lb (109.8 kg)  05/11/22 245 lb (111.1 kg)  05/07/22 245 lb 3.2 oz (111.2 kg)     GEN:  Well nourished, well developed in no acute distress HEENT: Normal NECK: No JVD; No carotid bruits LYMPHATICS: No lymphadenopathy CARDIAC: RRR, no murmurs, rubs, gallops RESPIRATORY:  Clear to auscultation without rales,  wheezing or rhonchi  ABDOMEN: Soft, non-tender, non-distended MUSCULOSKELETAL:  No edema; No deformity  SKIN: Warm and dry NEUROLOGIC:  Alert and oriented x 3 PSYCHIATRIC:  Normal affect    Signed, Shirlee More, MD  11/05/2022 8:44 AM    [place]

## 2022-11-05 ENCOUNTER — Ambulatory Visit: Payer: BC Managed Care – PPO | Attending: Cardiology | Admitting: Cardiology

## 2022-11-05 ENCOUNTER — Encounter: Payer: Self-pay | Admitting: Cardiology

## 2022-11-05 VITALS — BP 130/80 | HR 64 | Ht 72.0 in | Wt 242.0 lb

## 2022-11-05 DIAGNOSIS — E782 Mixed hyperlipidemia: Secondary | ICD-10-CM

## 2022-11-05 DIAGNOSIS — I2510 Atherosclerotic heart disease of native coronary artery without angina pectoris: Secondary | ICD-10-CM

## 2022-11-05 DIAGNOSIS — I1A0 Resistant hypertension: Secondary | ICD-10-CM | POA: Diagnosis not present

## 2022-11-05 NOTE — Patient Instructions (Signed)
Medication Instructions:  Your physician recommends that you continue on your current medications as directed. Please refer to the Current Medication list given to you today.  *If you need a refill on your cardiac medications before your next appointment, please call your pharmacy*   Lab Work: Your physician recommends that you return for lab work in: Today for Lipid panel and CMP  If you have labs (blood work) drawn today and your tests are completely normal, you will receive your results only by: MyChart Message (if you have [place]) OR A paper copy in the mail If you have any lab test that is abnormal or we need to change your treatment, we will call you to review the results.   Testing/Procedures: NONE   Follow-Up: At [facility], you and your health needs are our priority.  As part of our continuing mission to provide you with exceptional heart care, we have created designated Provider Care Teams.  These Care Teams include your primary Cardiologist (physician) and Advanced Practice Providers (APPs -  Physician Assistants and Nurse Practitioners) who all work together to provide you with the care you need, when you need it.  We recommend signing up for the patient portal called "MyChart".  Sign up information is provided on this After Visit Summary.  MyChart is used to connect with patients for Virtual Visits (Telemedicine).  Patients are able to view lab/test results, encounter notes, upcoming appointments, etc.  Non-urgent messages can be sent to your provider as well.   To learn more about what you can do with MyChart, go to [email or web address].    Your next appointment:   6 month(s)  Provider:   Shirlee More, MD    Other Instructions Check and record BP daily and bring to office next time you come. Goal of 8000 steps per day

## 2022-11-05 NOTE — Addendum Note (Signed)
Addended by: Jerl Santos R on: 11/05/2022 09:03 AM   Modules accepted: Orders

## 2022-11-06 LAB — LIPID PANEL
Chol/HDL Ratio: 3.3 ratio (ref 0.0–5.0)
Cholesterol, Total: 189 mg/dL (ref 100–199)
HDL: 58 mg/dL (ref >39)
LDL Chol Calc (NIH): 80 mg/dL (ref 0–99)
Triglycerides: 317 mg/dL — ABNORMAL HIGH (ref 0–149)
VLDL Cholesterol Cal: 51 mg/dL — ABNORMAL HIGH (ref 5–40)

## 2022-11-06 LAB — COMPREHENSIVE METABOLIC PANEL
ALT: 31 IU/L (ref 0–44)
AST: 24 IU/L (ref 0–40)
Albumin/Globulin Ratio: 1.8 (ref 1.2–2.2)
Albumin: 4.7 g/dL (ref 3.9–4.9)
Alkaline Phosphatase: 115 IU/L (ref 44–121)
BUN/Creatinine Ratio: 16 (ref 10–24)
BUN: 20 mg/dL (ref 8–27)
Bilirubin Total: 0.6 mg/dL (ref 0.0–1.2)
CO2: 22 mmol/L (ref 20–29)
Calcium: 9.6 mg/dL (ref 8.6–10.2)
Chloride: 97 mmol/L (ref 96–106)
Creatinine, Ser: 1.24 mg/dL (ref 0.76–1.27)
Globulin, Total: 2.6 g/dL (ref 1.5–4.5)
Glucose: 95 mg/dL (ref 70–99)
Potassium: 4.9 mmol/L (ref 3.5–5.2)
Sodium: 135 mmol/L (ref 134–144)
Total Protein: 7.3 g/dL (ref 6.0–8.5)
eGFR: 66 mL/min/{1.73_m2} (ref >59)

## 2022-11-08 ENCOUNTER — Ambulatory Visit: Payer: BC Managed Care – PPO | Admitting: Allergy and Immunology

## 2022-11-08 ENCOUNTER — Encounter: Payer: Self-pay | Admitting: Allergy and Immunology

## 2022-11-08 VITALS — BP 124/88 | HR 64 | Resp 14 | Ht 71.2 in | Wt 246.0 lb

## 2022-11-08 DIAGNOSIS — K219 Gastro-esophageal reflux disease without esophagitis: Secondary | ICD-10-CM

## 2022-11-08 DIAGNOSIS — J301 Allergic rhinitis due to pollen: Secondary | ICD-10-CM

## 2022-11-08 DIAGNOSIS — J4540 Moderate persistent asthma, uncomplicated: Secondary | ICD-10-CM

## 2022-11-08 DIAGNOSIS — J3089 Other allergic rhinitis: Secondary | ICD-10-CM | POA: Diagnosis not present

## 2022-11-08 MED ORDER — AIRDUO DIGIHALER 113-14 MCG/ACT IN AEPB: INHALATION_SPRAY | RESPIRATORY_TRACT | 5 refills | Status: DC

## 2022-11-08 MED ORDER — AIRSUPRA 90-80 MCG/ACT IN AERO: 2.0000 | INHALATION_SPRAY | RESPIRATORY_TRACT | 1 refills | Status: DC | PRN

## 2022-11-08 MED ORDER — IPRATROPIUM BROMIDE 0.06 % NA SOLN: NASAL | 5 refills | Status: DC

## 2022-11-08 MED ORDER — RYALTRIS 665-25 MCG/ACT NA SUSP: NASAL | 5 refills | Status: DC

## 2022-11-08 NOTE — Patient Instructions (Addendum)
  1.  Allergen avoidance measures - dust mite, cat, pollen, mold  2.  Treat and prevent inflammation:  A. Ryaltris - 2 sprays each nostril 1-2 times per day or B. OTC Flonase - 2 sprays each nostril 1 time per day C. AirDuo 113 - 1 inhalation 1-2 times per day  3.  Treat and prevent reflux/LPR:  A.  Decrease caffeine and alcohol consumption B.  Replace throat clearing with swallowing/drinking maneuver C.  Omeprazole to 40 mg - 1 tablet twice a day  4.  If needed:  A. AirSupra - 2 inhalations every 4-6 hours (Coupon) B. Ipratropium 0.06% - 2 sprays each nostril every 6 hours to dry nose C. OTC antihistamine - Allegra / zyrtec / xyzal  5.  Consider starting a course of immunotherapy if medical therapy fails  6. Return to clinic in 6 months or earlier if problem

## 2022-11-08 NOTE — Progress Notes (Unsigned)
[facility] [place] [facility] [place]   Follow-up Note  Referring Provider: Street, [name], * Primary Provider: Street, [name], MD Date of Office Visit: 11/08/2022  Subjective:   Terry Stanley (DOB: Oct 09, 1960) is a 62 y.o. male who returns to the [facility] on 11/08/2022 in re-evaluation of the following:  HPI: Timmy returns to this clinic in evaluation of asthma, allergic rhinitis, LPR, and history of lung granuloma.  I last saw him in this clinic 13-Nov-2021.  His cat passed away and he has been without cat exposure since sometime in February 2024 and he is so much better regarding his asthma at this point in time that he has been able to stop his AirDuo and does not use any short acting bronchodilator.  Still continues to have some intermittent nasal congestion and he does not use any nasal steroid at this point.  It does not sound as though he has required a systemic steroid or antibiotic in the treatment of his atopic respiratory disease.  His reflux has been under good control on his current plan.  He still remains somewhat hoarse.  His evaluation with ENT last year did not identify any laryngeal abnormalities.  There might be a temporal relationship between the performance of his neck surgery and the development of his hoarseness but he cannot really remember the timeline to well.  He is having a little bit more problems with his neck and he will be visiting with his surgeon to address this issue.  He had an MRI of his neck last week which identified some disc problems above his C4-7 fusion.  He had lower back surgery in October 2023 with good result.  Allergies as of 11/08/2022       Reactions   Bee Venom Swelling   Carvedilol    Other reaction(s): Wheezing (ALLERGY/intolerance) Per Dr Bettina Gavia notes-caused exacerbation of asthma symptoms 07/2017        Medication List    AirDuo Digihaler 113-14 MCG/ACT Aepb Generic  drug: Fluticasone-Salmeterol(sensor) 1 inhalation 2 times per day   ALPRAZolam 1 MG tablet Commonly known as: XANAX Take 0.5-1 mg by mouth daily as needed for anxiety or sleep.   ascorbic acid 500 MG tablet Commonly known as: VITAMIN C Take 500 mg by mouth daily.   atorvastatin 40 MG tablet Commonly known as: LIPITOR Take 1 tablet (40 mg total) by mouth daily.   cloNIDine 0.1 MG tablet Commonly known as: CATAPRES TAKE 1 TABLET BY MOUTH 2 TIMES DAILY.   CO Q 10 PO Take 1 tablet by mouth at bedtime.   Fish Oil 1000 MG Caps Take 1 capsule by mouth daily.   hydrALAZINE 50 MG tablet Commonly known as: APRESOLINE Take 1 tablet (50 mg total) by mouth 2 (two) times daily.   hydrochlorothiazide 12.5 MG tablet Commonly known as: HYDRODIURIL TAKE 1 TABLET EVERY DAY   HYDROcodone-acetaminophen 10-325 MG tablet Commonly known as: NORCO Take 1 tablet by mouth every 6 (six) hours as needed for moderate pain.   ibuprofen 200 MG tablet Commonly known as: ADVIL Take 600 mg by mouth every 6 (six) hours as needed for mild pain or moderate pain.   ipratropium 0.06 % nasal spray Commonly known as: ATROVENT 2 SPRAYS IN EACH NOSTRIL EVERY 6 HOURS AS NEEDED TO DRY NOSE   methocarbamol 750 MG tablet Commonly known as: ROBAXIN Take 750 mg by mouth every 8 (eight) hours as needed for muscle spasms.  metoprolol tartrate 25 MG tablet Commonly known as: LOPRESSOR TAKE ONE (1) TABLET BY MOUTH TWO (2) TIMES DAILY   nitroGLYCERIN 0.4 MG SL tablet Commonly known as: NITROSTAT Place 1 tablet (0.4 mg total) under the tongue every 5 (five) minutes as needed for chest pain.   olmesartan 40 MG tablet Commonly known as: Benicar Take 1 tablet (40 mg total) by mouth daily.   omeprazole 40 MG capsule Commonly known as: PRILOSEC TAKE 1 CAPSULE BY MOUTH TWICE A DAY   ProAir Digihaler 108 (90 Base) MCG/ACT Aepb Generic drug: Albuterol Sulfate (sensor) 2 inhalations every 4-6 hours as needed    Ryaltris 665-25 MCG/ACT Susp Generic drug: Olopatadine-Mometasone Place 2 sprays into the nose 2 (two) times daily.   tamsulosin 0.4 MG Caps capsule Commonly known as: FLOMAX Take 0.4 mg by mouth daily.   traZODone 50 MG tablet Commonly known as: DESYREL Take 50 mg by mouth at bedtime.    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   HENT: Negative.    Eyes: Negative.   Respiratory: Negative.    Cardiovascular: Negative.   Gastrointestinal: Negative.   Genitourinary: Negative.   Musculoskeletal: Negative.   Skin: Negative.   Neurological: Negative.   Endo/Heme/Allergies: Negative.   Psychiatric/Behavioral: Negative.       Objective:   Vitals:   11/08/22 1142  BP: 124/88  Pulse: 64  Resp: 14  SpO2: 98%   Height: 5' 11.2" (180.8 cm)  Weight: 246 lb (111.6 kg)   Physical Exam Constitutional:      Appearance: He is not diaphoretic.  HENT:     Head: Normocephalic.      Right Ear: Tympanic membrane, ear canal and external ear normal.     Left Ear: Tympanic membrane, ear canal and external ear normal.     Nose: Nose normal. No mucosal edema or rhinorrhea.     Mouth/Throat:     Pharynx: Uvula midline. No oropharyngeal exudate.  Eyes:     Conjunctiva/sclera: Conjunctivae normal.  Neck:     Thyroid: No thyromegaly.     Trachea: Trachea normal. No tracheal tenderness or tracheal deviation.  Cardiovascular:     Rate and Rhythm: Normal rate and regular rhythm.     Heart sounds: Normal heart sounds, S1 normal and S2 normal. No murmur heard. Pulmonary:     Effort: No respiratory distress.     Breath sounds:  Normal breath sounds. No stridor. No wheezing or rales.  Lymphadenopathy:     Head:     Right side of head: No tonsillar adenopathy.     Left side of head: No tonsillar adenopathy.     Cervical: No cervical adenopathy.  Skin:    Findings: No erythema or rash.     Nails: There is no clubbing.  Neurological:     Mental Status: He is alert.     Diagnostics:    Spirometry was performed and demonstrated an FEV1 of 2.48 at 68 % of predicted.  Assessment and Plan:   1. Asthma, moderate persistent, well-controlled   2. Perennial allergic rhinitis   3. Seasonal allergic rhinitis due to pollen   4. LPRD (laryngopharyngeal reflux disease)    1.  Allergen avoidance measures - dust mite, cat, pollen, mold  2.  Treat and prevent inflammation:  A. Ryaltris - 2 sprays each nostril 1-2 times per day or B. OTC Flonase - 2 sprays each nostril 1 time per day C. AirDuo 113 - 1 inhalation 1-2 times per day  3.  Treat and prevent reflux/LPR:  A.  Decrease caffeine and alcohol consumption B.  Replace throat clearing with swallowing/drinking maneuver C.  Omeprazole to 40 mg - 1 tablet twice a day  4.  If needed:  A. AirSupra - 2 inhalations every 4-6 hours (Coupon) B. Ipratropium 0.06% - 2 sprays each nostril every 6 hours to dry nose C. OTC antihistamine  - Allegra / zyrtec / xyzal  5.  Consider starting a course of immunotherapy if medical therapy fails  6. Return to clinic in 6 months or earlier if problem  Timmy appears to be doing okay regarding his respiratory tract issue at this point in time.  Since he has had elimination of the cat from the household he is definitely doing a lot better.  But I am pretty sure as he goes through this upcoming pollination season he is definitely going to need anti-inflammatory medication for both his upper and lower airway and I have encouraged him to use these agents.  His reflux appears to be doing pretty well.  He can remain on omeprazole.  I have given him a combination albuterol/budesonide inhaler to use as a rescue medicine.  I will see him back in this clinic in 6 months or earlier if there is a problem.  Allena Katz, MD Allergy / Immunology [place]

## 2022-11-09 ENCOUNTER — Encounter: Payer: Self-pay | Admitting: Allergy and Immunology

## 2022-11-11 ENCOUNTER — Telehealth: Payer: Self-pay

## 2022-11-11 NOTE — Telephone Encounter (Signed)
PA request received via CMM for Airsupra 90-80MCG/ACT aerosol  PA has been submitted to [facility] and is pending determination  Key: [id number]

## 2022-11-13 DIAGNOSIS — J324 Chronic pansinusitis: Secondary | ICD-10-CM | POA: Diagnosis not present

## 2022-11-13 DIAGNOSIS — R0981 Nasal congestion: Secondary | ICD-10-CM | POA: Diagnosis not present

## 2022-11-13 DIAGNOSIS — R070 Pain in throat: Secondary | ICD-10-CM | POA: Diagnosis not present

## 2022-11-13 DIAGNOSIS — R519 Headache, unspecified: Secondary | ICD-10-CM | POA: Diagnosis not present

## 2022-11-15 NOTE — Telephone Encounter (Signed)
PA has been DENIED, no additional information provided at this time.

## 2022-11-17 DIAGNOSIS — I25119 Atherosclerotic heart disease of native coronary artery with unspecified angina pectoris: Secondary | ICD-10-CM | POA: Diagnosis not present

## 2022-11-17 DIAGNOSIS — J40 Bronchitis, not specified as acute or chronic: Secondary | ICD-10-CM | POA: Diagnosis not present

## 2022-11-17 DIAGNOSIS — I10 Essential (primary) hypertension: Secondary | ICD-10-CM | POA: Diagnosis not present

## 2022-11-17 DIAGNOSIS — J329 Chronic sinusitis, unspecified: Secondary | ICD-10-CM | POA: Diagnosis not present

## 2022-11-19 ENCOUNTER — Other Ambulatory Visit: Payer: Self-pay | Admitting: Cardiology

## 2022-11-23 DIAGNOSIS — J40 Bronchitis, not specified as acute or chronic: Secondary | ICD-10-CM | POA: Diagnosis not present

## 2022-11-23 DIAGNOSIS — J329 Chronic sinusitis, unspecified: Secondary | ICD-10-CM | POA: Diagnosis not present

## 2022-11-24 ENCOUNTER — Encounter: Payer: Self-pay | Admitting: Allergy and Immunology

## 2022-11-29 DIAGNOSIS — M47812 Spondylosis without myelopathy or radiculopathy, cervical region: Secondary | ICD-10-CM | POA: Diagnosis not present

## 2022-11-29 DIAGNOSIS — M4326 Fusion of spine, lumbar region: Secondary | ICD-10-CM | POA: Diagnosis not present

## 2022-11-29 DIAGNOSIS — I10 Essential (primary) hypertension: Secondary | ICD-10-CM | POA: Diagnosis not present

## 2022-11-29 DIAGNOSIS — Z6834 Body mass index (BMI) 34.0-34.9, adult: Secondary | ICD-10-CM | POA: Diagnosis not present

## 2022-12-07 MED ORDER — AIRSUPRA 90-80 MCG/ACT IN AERO: 2.0000 | INHALATION_SPRAY | RESPIRATORY_TRACT | 1 refills | Status: DC | PRN

## 2022-12-07 NOTE — Addendum Note (Signed)
Addended by: Felipa Emory on: 12/07/2022 08:58 AM   Modules accepted: Orders

## 2022-12-10 DIAGNOSIS — Z6834 Body mass index (BMI) 34.0-34.9, adult: Secondary | ICD-10-CM | POA: Diagnosis not present

## 2022-12-10 DIAGNOSIS — R600 Localized edema: Secondary | ICD-10-CM | POA: Diagnosis not present

## 2022-12-27 DIAGNOSIS — M47812 Spondylosis without myelopathy or radiculopathy, cervical region: Secondary | ICD-10-CM | POA: Diagnosis not present

## 2023-01-10 DIAGNOSIS — R609 Edema, unspecified: Secondary | ICD-10-CM | POA: Diagnosis not present

## 2023-01-10 DIAGNOSIS — E669 Obesity, unspecified: Secondary | ICD-10-CM | POA: Diagnosis not present

## 2023-01-10 DIAGNOSIS — E538 Deficiency of other specified B group vitamins: Secondary | ICD-10-CM | POA: Diagnosis not present

## 2023-01-10 DIAGNOSIS — I25119 Atherosclerotic heart disease of native coronary artery with unspecified angina pectoris: Secondary | ICD-10-CM | POA: Diagnosis not present

## 2023-01-18 DIAGNOSIS — D485 Neoplasm of uncertain behavior of skin: Secondary | ICD-10-CM | POA: Diagnosis not present

## 2023-01-18 DIAGNOSIS — L820 Inflamed seborrheic keratosis: Secondary | ICD-10-CM | POA: Diagnosis not present

## 2023-01-18 DIAGNOSIS — L570 Actinic keratosis: Secondary | ICD-10-CM | POA: Diagnosis not present

## 2023-02-01 DIAGNOSIS — M47812 Spondylosis without myelopathy or radiculopathy, cervical region: Secondary | ICD-10-CM | POA: Diagnosis not present

## 2023-02-28 DIAGNOSIS — M47812 Spondylosis without myelopathy or radiculopathy, cervical region: Secondary | ICD-10-CM | POA: Diagnosis not present

## 2023-03-08 DIAGNOSIS — D0439 Carcinoma in situ of skin of other parts of face: Secondary | ICD-10-CM | POA: Diagnosis not present

## 2023-03-14 DIAGNOSIS — M47812 Spondylosis without myelopathy or radiculopathy, cervical region: Secondary | ICD-10-CM | POA: Diagnosis not present

## 2023-04-05 DIAGNOSIS — G894 Chronic pain syndrome: Secondary | ICD-10-CM | POA: Diagnosis not present

## 2023-04-05 DIAGNOSIS — M5106 Intervertebral disc disorders with myelopathy, lumbar region: Secondary | ICD-10-CM | POA: Diagnosis not present

## 2023-04-05 DIAGNOSIS — M791 Myalgia, unspecified site: Secondary | ICD-10-CM | POA: Diagnosis not present

## 2023-04-05 DIAGNOSIS — M542 Cervicalgia: Secondary | ICD-10-CM | POA: Diagnosis not present

## 2023-04-05 DIAGNOSIS — Z79899 Other long term (current) drug therapy: Secondary | ICD-10-CM | POA: Diagnosis not present

## 2023-04-05 DIAGNOSIS — M47812 Spondylosis without myelopathy or radiculopathy, cervical region: Secondary | ICD-10-CM | POA: Diagnosis not present

## 2023-04-05 DIAGNOSIS — Z79891 Long term (current) use of opiate analgesic: Secondary | ICD-10-CM | POA: Diagnosis not present

## 2023-04-11 DIAGNOSIS — L98499 Non-pressure chronic ulcer of skin of other sites with unspecified severity: Secondary | ICD-10-CM | POA: Diagnosis not present

## 2023-05-03 ENCOUNTER — Other Ambulatory Visit: Payer: Self-pay | Admitting: Cardiology

## 2023-05-03 DIAGNOSIS — I1A0 Resistant hypertension: Secondary | ICD-10-CM

## 2023-05-16 ENCOUNTER — Other Ambulatory Visit: Payer: Self-pay | Admitting: Cardiology

## 2023-05-18 DIAGNOSIS — H00015 Hordeolum externum left lower eyelid: Secondary | ICD-10-CM | POA: Diagnosis not present

## 2023-05-24 DIAGNOSIS — M47812 Spondylosis without myelopathy or radiculopathy, cervical region: Secondary | ICD-10-CM | POA: Diagnosis not present

## 2023-05-24 DIAGNOSIS — H00015 Hordeolum externum left lower eyelid: Secondary | ICD-10-CM | POA: Diagnosis not present

## 2023-06-09 ENCOUNTER — Other Ambulatory Visit: Payer: Self-pay

## 2023-06-09 ENCOUNTER — Telehealth: Payer: Self-pay | Admitting: Cardiology

## 2023-06-09 ENCOUNTER — Other Ambulatory Visit: Payer: Self-pay | Admitting: Cardiology

## 2023-06-09 MED ORDER — HYDRALAZINE HCL 50 MG PO TABS: 50.0000 mg | ORAL_TABLET | Freq: Two times a day (BID) | ORAL | 3 refills | Status: DC

## 2023-06-09 NOTE — Telephone Encounter (Signed)
*  STAT* If patient is at the pharmacy, call can be transferred to refill team.   1. Which medications need to be refilled? (please list name of each medication and dose if known)   hydrALAZINE (APRESOLINE) 50 MG tablet    2. Which pharmacy/location (including street and city if local pharmacy) is medication to be sent to? [id number] - [place],  - [address]   3. Do they need a 30 day or 90 day supply? 90

## 2023-06-09 NOTE — Telephone Encounter (Signed)
Patient's medication was re-filled. Called patient and informed him that his Hydralazine medication had been re-filled. Patient had no further questions at this time.

## 2023-06-22 ENCOUNTER — Encounter: Payer: Self-pay | Admitting: Gastroenterology

## 2023-06-28 ENCOUNTER — Ambulatory Visit (AMBULATORY_SURGERY_CENTER): Payer: BC Managed Care – PPO

## 2023-06-28 VITALS — Ht 72.0 in | Wt 240.0 lb

## 2023-06-28 DIAGNOSIS — Z860100 Personal history of colon polyps, unspecified: Secondary | ICD-10-CM

## 2023-06-28 DIAGNOSIS — Z1211 Encounter for screening for malignant neoplasm of colon: Secondary | ICD-10-CM

## 2023-06-28 MED ORDER — NA SULFATE-K SULFATE-MG SULF 17.5-3.13-1.6 GM/177ML PO SOLN
1.0000 | Freq: Once | ORAL | 0 refills | Status: AC
Start: 2023-06-28 — End: 2023-06-28

## 2023-06-28 NOTE — Progress Notes (Signed)
No egg or soy allergy known to patient  No issues known to pt with past sedation with any surgeries or procedures: hx PONV  Patient denies ever being told they had issues or difficulty with intubation  No FH of Malignant Hyperthermia Pt is not on diet pills Pt is not on  home 02  Pt is not on blood thinners  Pt denies issues with constipation  No A fib or A flutter Have any cardiac testing pending--no LOA: independent  Prep: suprep   PV competed with patient. Prep instructions sent via mychart and home address. [facility] coupon for [facility] provided to use for price reduction if needed.

## 2023-07-03 NOTE — Progress Notes (Unsigned)
Cardiology Office Note:    Date:  07/04/2023   ID:  Hadi Francy, DOB 1961/01/03, MRN [id number]  PCP:  Street, Stephanie Coup, MD  Cardiologist:  Norman Herrlich, MD    Referring MD: [address], Stephanie Coup, *    ASSESSMENT:    1. Bilateral lower extremity edema   2. Coronary artery disease involving native coronary artery of native heart without angina pectoris   3. Resistant hypertension   4. Mixed hyperlipidemia    PLAN:    In order of problems listed above:  He had transient lower extremity edema related initially to steroid therapy subsequently due to Lyrica both of which was resolved he stopped Lyrica he is off loop diuretic and not having edema. Stable anginal discomfort continue his current medical treatment including his beta-blocker high intensity statin. Presently well-controlled on multidrug regimen including clonidine beta-blocker and ARB Recheck a lipid profile and continue his high intensity statin   Next appointment: 6 months   Medication Adjustments/Labs and Tests Ordered: Current medicines are reviewed at length with the patient today.  Concerns regarding medicines are outlined above.  Orders Placed This Encounter  Procedures   EKG 12-Lead   No orders of the defined types were placed in this encounter.    History of Present Illness:    Natividad Hamade is a 62 y.o. male with a hx of CAD previously resistant hypertension and hyper lipidemia last seen 11/05/2022.  His cardiac CTA showed a calcium score of 88th percentile and moderate CAD 50 to 60% LAD less than 50% left circumflex and marginal stenosis  Compliance with diet, lifestyle and medications: Yes  This visit was actually scheduled because he was having lower extremity edema.  Initially occurred in the setting bronchodilator with steroids and steroids to treat a flulike illness after his last visit he was placed on torsemide resolved stop the diuretic and he was put on Lyrica for pain and developed  severe edema research stopped Lyrica stopped his diuretic and edema is gone. He is not checking his home blood pressure He has had no shortness of breath chest pain palpitation or syncope  Most recent labs 11/05/2022 LDL 80 cholesterol 189 A1c 5.2 creatinine 1.2 potassium 4.9 Past Medical History:  Diagnosis Date   Asthma 05/26/2017   Cervical radiculopathy due to degenerative joint disease of spine 06/12/2019   Cervical radiculopathy due to degenerative joint disease of spine 06/12/2019   Edema 04/07/2018   Elevated coronary artery calcium score 08/01/2019   Essential hypertension 08/23/2016   Hyperlipidemia 10/15/2011   Low back pain 10/15/2011   Neoplasm of skin of upper limb or shoulder, benign 01/30/2016   PONV (postoperative nausea and vomiting) 05/26/2017   Resistant hypertension 08/23/2016   Spinal stenosis of lumbar region 10/15/2011   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  Rinse, gargle and spit after use. (Patient taking differently: Inhale 1 puff into the lungs 2 (two) times daily. Inhale one puff one to two times daily to prevent cough or wheeze.  Rinse, gargle and spit after use.)   Albuterol Sulfate, sensor, (PROAIR DIGIHALER) 108 (90 Base) MCG/ACT AEPB 2 inhalations every 4-6 hours as needed (Patient taking differently: Inhale 2 puffs into the lungs every 4 (four) hours as needed (SOB wheezing). 2 inhalations every 4-6 hours as  needed)   Albuterol-Budesonide (AIRSUPRA) 90-80 MCG/ACT AERO Inhale 2 puffs into the lungs as needed (every four to six hours for cough, wheeze, shortness of breath.  Rinse, gargle, and spit after use).   ALPRAZolam (XANAX) 1 MG tablet Take 0.5-1 mg by mouth daily as needed for anxiety or sleep.   atorvastatin (LIPITOR) 40 MG tablet Take 1 tablet  (40 mg total) by mouth daily.   cloNIDine (CATAPRES) 0.1 MG tablet TAKE 1 TABLET BY MOUTH 2 TIMES DAILY.   Coenzyme Q10 (CO Q 10 PO) Take 1 tablet by mouth at bedtime.   hydrALAZINE (APRESOLINE) 50 MG tablet Take 1 tablet (50 mg total) by mouth 2 (two) times daily.   hydrochlorothiazide (HYDRODIURIL) 12.5 MG tablet Take 1 tablet (12.5 mg total) by mouth daily.   HYDROcodone-acetaminophen (NORCO) 10-325 MG tablet Take 1 tablet by mouth every 6 (six) hours as needed for moderate pain.   ibuprofen (ADVIL,MOTRIN) 200 MG tablet Take 600 mg by mouth every 6 (six) hours as needed for mild pain or moderate pain.   ipratropium (ATROVENT) 0.06 % nasal spray Can use two sprays in each nostril every 6 hours as needed to dry nose. (Patient taking differently: Place 2 sprays into both nostrils every 6 (six) hours as needed for rhinitis. Can use two sprays in each nostril every 6 hours as needed to dry nose.)   methocarbamol (ROBAXIN) 750 MG tablet Take 750 mg by mouth every 8 (eight) hours as needed for muscle spasms.   metoprolol tartrate (LOPRESSOR) 25 MG tablet TAKE ONE (1) TABLET BY MOUTH TWO (2) TIMES DAILY (Patient taking differently: Take 25 mg by mouth 2 (two) times daily. TAKE ONE (1) TABLET BY MOUTH TWO (2) TIMES DAILY)   olmesartan (BENICAR) 40 MG tablet TAKE 1 TABLET BY MOUTH EVERY DAY   Olopatadine-Mometasone (RYALTRIS) 665-25 MCG/ACT SUSP Use two sprays in each nostril one to two times per day. (Patient taking differently: Place 2 sprays into both nostrils 2 (two) times daily. Use two sprays in each nostril one to two times per day.)   Omega-3 Fatty Acids (FISH OIL) 1000 MG CAPS Take 1 capsule by mouth daily.   omeprazole (PRILOSEC) 40 MG capsule TAKE 1 CAPSULE BY MOUTH TWICE A DAY (Patient taking differently: Take 20 mg by mouth daily.)   ondansetron (ZOFRAN-ODT) 4 MG disintegrating tablet Take 4-8 mg by mouth every 8 (eight) hours as needed for nausea or vomiting.   PROCTOFOAM HC rectal foam Place 1  applicator rectally 3 (three) times daily.   tamsulosin (FLOMAX) 0.4 MG CAPS capsule Take 0.4 mg by mouth daily.   traZODone (DESYREL) 50 MG tablet Take 50 mg by mouth at bedtime as needed for sleep.   Turmeric 01-999 MG CAPS Take 1 capsule by mouth in the morning and at bedtime.   vitamin C (ASCORBIC ACID) 500 MG tablet Take 500 mg by mouth daily.       EKGs/Labs/Other Studies Reviewed:    The following studies were reviewed today:    EKG Interpretation Date/Time:  Monday July 04 2023 08:27:58 EDT Ventricular Rate:  67 PR Interval:  176 QRS Duration:  88 QT Interval:  394 QTC Calculation: 416 R Axis:   -18  Text Interpretation: Normal sinus rhythm Normal ECG When compared with ECG of 11-Dec-2007 11:36, No significant change was found Confirmed by Norman Herrlich ([id number]) on 07/04/2023 8:31:49 AM   Recent Labs: 11/05/2022: ALT 31; BUN 20; Creatinine, Ser 1.24; Potassium 4.9; Sodium 135  Recent Lipid Panel  Component Value Date/Time   CHOL 189 11/05/2022 0910   TRIG 317 (H) 11/05/2022 0910   HDL 58 11/05/2022 0910   CHOLHDL 3.3 11/05/2022 0910   LDLCALC 80 11/05/2022 0910   EKG Interpretation Date/Time:  Monday July 04 2023 08:27:58 EDT Ventricular Rate:  67 PR Interval:  176 QRS Duration:  88 QT Interval:  394 QTC Calculation: 416 R Axis:   -18  Text Interpretation: Normal sinus rhythm Normal ECG When compared with ECG of 11-Dec-2007 11:36, No significant change was found Confirmed by Norman Herrlich ([id number]) on 07/04/2023 8:31:49 AM   Physical Exam:    VS:  BP 130/80 (BP Location: Left Arm, Patient Position: Sitting)   Pulse 67   Ht 6' (1.829 m)   Wt 248 lb (112.5 kg)   SpO2 91%   BMI 33.63 kg/m     Wt Readings from Last 3 Encounters:  07/04/23 248 lb (112.5 kg)  06/28/23 240 lb (108.9 kg)  11/08/22 246 lb (111.6 kg)     GEN: Well nourished, well developed in no acute distress HEENT: Normal NECK: No JVD; No carotid bruits LYMPHATICS: No  lymphadenopathy CARDIAC: RRR, no murmurs, rubs, gallops RESPIRATORY:  Clear to auscultation without rales, wheezing or rhonchi  ABDOMEN: Soft, non-tender, non-distended MUSCULOSKELETAL:  No edema; No deformity  SKIN: Warm and dry NEUROLOGIC:  Alert and oriented x 3 PSYCHIATRIC:  Normal affect    Signed, Norman Herrlich, MD  07/04/2023 8:42 AM    [facility]

## 2023-07-04 ENCOUNTER — Ambulatory Visit: Payer: BC Managed Care – PPO | Attending: Cardiology | Admitting: Cardiology

## 2023-07-04 ENCOUNTER — Encounter: Payer: Self-pay | Admitting: Cardiology

## 2023-07-04 ENCOUNTER — Encounter: Payer: Self-pay | Admitting: Gastroenterology

## 2023-07-04 VITALS — BP 130/80 | HR 67 | Ht 72.0 in | Wt 248.0 lb

## 2023-07-04 DIAGNOSIS — I1A0 Resistant hypertension: Secondary | ICD-10-CM

## 2023-07-04 DIAGNOSIS — R600 Localized edema: Secondary | ICD-10-CM | POA: Diagnosis not present

## 2023-07-04 DIAGNOSIS — E782 Mixed hyperlipidemia: Secondary | ICD-10-CM | POA: Diagnosis not present

## 2023-07-04 DIAGNOSIS — I2510 Atherosclerotic heart disease of native coronary artery without angina pectoris: Secondary | ICD-10-CM

## 2023-07-04 NOTE — Patient Instructions (Signed)
Medication Instructions:  Your physician recommends that you continue on your current medications as directed. Please refer to the Current Medication list given to you today.  *If you need a refill on your cardiac medications before your next appointment, please call your pharmacy*   Lab Work: Your physician recommends that you return for lab work in:   Labs today: CMP, Lipid  If you have labs (blood work) drawn today and your tests are completely normal, you will receive your results only by: MyChart Message (if you have MyChart) OR A paper copy in the mail If you have any lab test that is abnormal or we need to change your treatment, we will call you to review the results.   Testing/Procedures: None   Follow-Up: At [facility], you and your health needs are our priority.  As part of our continuing mission to provide you with exceptional heart care, we have created designated Provider Care Teams.  These Care Teams include your primary Cardiologist (physician) and Advanced Practice Providers (APPs -  Physician Assistants and Nurse Practitioners) who all work together to provide you with the care you need, when you need it.  We recommend signing up for the patient portal called "MyChart".  Sign up information is provided on this After Visit Summary.  MyChart is used to connect with patients for Virtual Visits (Telemedicine).  Patients are able to view lab/test results, encounter notes, upcoming appointments, etc.  Non-urgent messages can be sent to your provider as well.   To learn more about what you can do with MyChart, go to [email or web address].    Your next appointment:   6 month(s)  Provider:   Norman Herrlich, MD    Other Instructions Check and record home blood pressures and bring to the office at your next visit

## 2023-07-05 LAB — COMPREHENSIVE METABOLIC PANEL
ALT: 32 [IU]/L (ref 0–44)
AST: 25 [IU]/L (ref 0–40)
Albumin: 4.6 g/dL (ref 3.9–4.9)
Alkaline Phosphatase: 124 [IU]/L — ABNORMAL HIGH (ref 44–121)
BUN/Creatinine Ratio: 11 (ref 10–24)
BUN: 13 mg/dL (ref 8–27)
Bilirubin Total: 0.4 mg/dL (ref 0.0–1.2)
CO2: 23 mmol/L (ref 20–29)
Calcium: 9.7 mg/dL (ref 8.6–10.2)
Chloride: 97 mmol/L (ref 96–106)
Creatinine, Ser: 1.15 mg/dL (ref 0.76–1.27)
Globulin, Total: 2.8 g/dL (ref 1.5–4.5)
Glucose: 108 mg/dL — ABNORMAL HIGH (ref 70–99)
Potassium: 4.5 mmol/L (ref 3.5–5.2)
Sodium: 136 mmol/L (ref 134–144)
Total Protein: 7.4 g/dL (ref 6.0–8.5)
eGFR: 72 mL/min/{1.73_m2} (ref >59)

## 2023-07-05 LAB — LIPID PANEL
Chol/HDL Ratio: 4.1 ratio (ref 0.0–5.0)
Cholesterol, Total: 165 mg/dL (ref 100–199)
HDL: 40 mg/dL (ref >39)
LDL Chol Calc (NIH): 92 mg/dL (ref 0–99)
Triglycerides: 192 mg/dL — ABNORMAL HIGH (ref 0–149)
VLDL Cholesterol Cal: 33 mg/dL (ref 5–40)

## 2023-07-08 ENCOUNTER — Telehealth: Payer: Self-pay

## 2023-07-08 ENCOUNTER — Telehealth: Payer: Self-pay | Admitting: Cardiology

## 2023-07-08 NOTE — Telephone Encounter (Signed)
Pt c/o BP issue: STAT if pt c/o blurred vision, one-sided weakness or slurred speech  1. What are your last 5 BP readings?  10/29 169/95 (without meds) 150's/95 (with meds, 20 mins later), 135/80 (midday)  10/30 164/95 6:45 am (without meds), 152/88 7:20 am (with meds), 7:30 pm 159/85  10/31 162/84 6:15 am (without meds), 139/81 7:20 pm (with meds) 11/01 179/103 6:45 am (without meds), 138/88 (with meds)   2. Are you having any other symptoms (ex. Dizziness, headache, blurred vision, passed out)? No   3. What is your BP issue? Hypertension.   Patient is concerned due to being scheduled for a colonoscopy 11/08.

## 2023-07-08 NOTE — Telephone Encounter (Signed)
Lab Results reviewed with pt as per Dr. Hulen Shouts note.  Pt verbalized understanding and had no additional questions. Routed to PCP

## 2023-07-08 NOTE — Telephone Encounter (Signed)
Spoke with pt regarding message and Dr. Hulen Shouts reply. Pt agreed to check blood pressures per Dr. Hulen Shouts note and send readings on Monday. He verbalized understanding and had no further questions.

## 2023-07-15 ENCOUNTER — Encounter: Payer: BC Managed Care – PPO | Admitting: Gastroenterology

## 2023-07-18 DIAGNOSIS — M47812 Spondylosis without myelopathy or radiculopathy, cervical region: Secondary | ICD-10-CM | POA: Diagnosis not present

## 2023-07-19 ENCOUNTER — Telehealth: Payer: Self-pay | Admitting: Cardiology

## 2023-07-19 ENCOUNTER — Other Ambulatory Visit: Payer: Self-pay

## 2023-07-19 ENCOUNTER — Telehealth: Payer: Self-pay

## 2023-07-19 MED ORDER — NEBIVOLOL HCL 10 MG PO TABS: 10.0000 mg | ORAL_TABLET | Freq: Every day | ORAL | 3 refills | Status: DC

## 2023-07-19 NOTE — Telephone Encounter (Signed)
Patient dropped off BP readings last week and hasn't heard anything- please call when available

## 2023-07-19 NOTE — Telephone Encounter (Signed)
Walked into the office and carrying a list of blood pressures that are elevated  Transition metoprolol to carvedilol send a list by MyChart in 1 week  I advised to be careful and not check blood pressures first thing in the morning before medications best to do 1 to 2 hours afterwards

## 2023-07-19 NOTE — Telephone Encounter (Signed)
Patient came to the office carrying his blood pressure log and stating that with all the medications he is taking his blood pressure should be this high. Showed the results to Dr. Dulce Sellar and informed him regarding the patients statements. Dr. Dulce Sellar reviewed the patient's chart and recommended the following:  D/C Metoprolol Start: Bystolic 10 mg daily Re-enforced with the patient to take his blood pressure 2 hours after taking blood pressure medication in the AM. Check his blood pressure daily for the next week and then after 1 week send a list of blood pressures to the office.  These recommendations were reviewed with the patient and he verbalized understanding and had no further questions at this time.

## 2023-08-02 ENCOUNTER — Telehealth: Payer: Self-pay | Admitting: *Deleted

## 2023-08-02 NOTE — Telephone Encounter (Signed)
Patient advised had to stop Airsupra due to outside of mouth breaking out and leg swelling. He was using proair for rescue and has needed it more than usual but currently out of same. He is using some Breo he had on hand several days a week due to some wheezing in the last week. Pleas advise

## 2023-08-02 NOTE — Telephone Encounter (Signed)
Patient advised and appt scheduled

## 2023-08-08 ENCOUNTER — Ambulatory Visit: Payer: BC Managed Care – PPO | Admitting: Allergy and Immunology

## 2023-08-08 ENCOUNTER — Encounter: Payer: Self-pay | Admitting: Allergy and Immunology

## 2023-08-08 VITALS — BP 148/80 | HR 52 | Resp 12 | Ht 70.5 in | Wt 251.2 lb

## 2023-08-08 DIAGNOSIS — J301 Allergic rhinitis due to pollen: Secondary | ICD-10-CM | POA: Diagnosis not present

## 2023-08-08 DIAGNOSIS — J4540 Moderate persistent asthma, uncomplicated: Secondary | ICD-10-CM | POA: Diagnosis not present

## 2023-08-08 DIAGNOSIS — J3089 Other allergic rhinitis: Secondary | ICD-10-CM

## 2023-08-08 DIAGNOSIS — K219 Gastro-esophageal reflux disease without esophagitis: Secondary | ICD-10-CM

## 2023-08-08 MED ORDER — BUDESONIDE-FORMOTEROL FUMARATE 160-4.5 MCG/ACT IN AERO: INHALATION_SPRAY | RESPIRATORY_TRACT | 5 refills | Status: DC

## 2023-08-08 MED ORDER — RYALTRIS 665-25 MCG/ACT NA SUSP: NASAL | 5 refills | Status: AC

## 2023-08-08 MED ORDER — ALBUTEROL SULFATE HFA 108 (90 BASE) MCG/ACT IN AERS: INHALATION_SPRAY | RESPIRATORY_TRACT | 1 refills | Status: DC

## 2023-08-08 NOTE — Progress Notes (Unsigned)
[facility] [place] - [place] - [place] - [place]   Follow-up Note  Referring Provider: Street, Stephanie Coup, * Primary Provider: Street, Stephanie Coup, MD Date of Office Visit: 08/08/2023  Subjective:   Terry Stanley (DOB: 08/19/1961) is a 62 y.o. male who [facility] in re-evaluation of the following:  HPI: Terry Stanley returns to this clinic in evaluation of asthma, allergic rhinitis, LPR, and history of lung granuloma.  I last saw him in this clinic for March 2024.  Apparently soon after seeing me during his last visit in March 2024 he contracted influenza and he was given high doses of prednisone along with "for antibiotics" and then he developed some significant side effects including lower extremity swelling and what sounds like fungal glossitis.  He thinks that his lower extremity swelling and his mouth problem may have been a manifestation of using his Paul[place] Floor but again he was treated with several courses of antibiotics and prednisone at that point in time.  He still occasionally has some issues with his chest causing him to have some slight cough and some chest congestion and he also has some nasal congestion.  He is no longer using a controller agent for asthma and he is no longer using a combination nasal steroid/antihistamine spray but is relying on the use of Flonase.  He believes that his reflux and his throat issue including his hoarseness is under excellent control while using omeprazole just 1 time per day.  Allergies as of 08/08/2023       Reactions   Bee Venom Swelling   Carvedilol    Other reaction(s): Wheezing (ALLERGY/intolerance) Per Dr Dulce Sellar notes-caused exacerbation of asthma symptoms 07/2017        Medication List    AirDuo Digihaler 113-14 MCG/ACT Aepb Generic drug: Fluticasone-Salmeterol(sensor) Inhale one puff one to two times daily to prevent cough or wheeze.  Rinse, gargle and spit after use. What  changed:    Airsupra 90-80 MCG/ACT Aero Generic drug: Albuterol-Budesonide Inhale 2 puffs into the lungs as needed (every four to six hours for cough, wheeze, shortness of breath.  Rinse, gargle, and spit after use).   ALPRAZolam 1 MG tablet Commonly known as: XANAX Take 0.5-1 mg by mouth daily as needed for anxiety or sleep.   ascorbic acid 500 MG tablet Commonly known as: VITAMIN C Take 500 mg by mouth daily.   atorvastatin 40 MG tablet Commonly known as: LIPITOR Take 1 tablet (40 mg total) by mouth daily.   cloNIDine 0.1 MG tablet Commonly known as: CATAPRES TAKE 1 TABLET BY MOUTH 2 TIMES DAILY.   CO Q 10 PO Take 1 tablet by mouth at bedtime.   Fish Oil 1000 MG Caps Take 1 capsule by mouth daily.   hydrALAZINE 50 MG tablet Commonly known as: APRESOLINE Take 1 tablet (50 mg total) by mouth 2 (two) times daily.   hydrochlorothiazide 12.5 MG tablet Commonly known as: HYDRODIURIL Take 1 tablet (12.5 mg total) by mouth daily.   HYDROcodone-acetaminophen 10-325 MG tablet Commonly known as: NORCO Take 1 tablet by mouth every 6 (six) hours as needed for moderate pain.   ibuprofen 200 MG tablet Commonly known as: ADVIL Take 600 mg by mouth every 6 (six) hours as needed for mild pain or moderate pain.   ipratropium 0.06 % nasal spray Commonly known as: ATROVENT Can use two sprays in each nostril every 6 hours as needed to dry nose.   methocarbamol 750 MG tablet  Commonly known as: ROBAXIN Take 750 mg by mouth every 8 (eight) hours as needed for muscle spasms.   nebivolol 10 MG tablet Commonly known as: Bystolic Take 1 tablet (10 mg total) by mouth daily.   nitroGLYCERIN 0.4 MG SL tablet Commonly known as: NITROSTAT Place 1 tablet (0.4 mg total) under the tongue every 5 (five) minutes as needed for chest pain.   olmesartan 40 MG tablet Commonly known as: BENICAR TAKE 1 TABLET BY MOUTH EVERY DAY   omeprazole 40 MG capsule Commonly known as: PRILOSEC TAKE 1  CAPSULE BY MOUTH TWICE A DAY   omeprazole 20 MG capsule Commonly known as: PRILOSEC Take 20 mg by mouth 2 (two) times daily.   ondansetron 4 MG disintegrating tablet Commonly known as: ZOFRAN-ODT Take 4-8 mg by mouth every 8 (eight) hours as needed for nausea or vomiting.   ProAir Digihaler 108 (90 Base) MCG/ACT Aepb Generic drug: Albuterol Sulfate (sensor) 2 inhalations every 4-6 hours as needed   Proctofoam HC rectal foam Generic drug: hydrocortisone-pramoxine Place 1 applicator rectally 3 (three) times daily.   Ryaltris 409-81 MCG/ACT Susp Generic drug: Olopatadine-Mometasone Use two sprays in each nostril one to two times per day.   tadalafil 10 MG tablet Commonly known as: CIALIS Take 10 mg by mouth daily.   tamsulosin 0.4 MG Caps capsule Commonly known as: FLOMAX Take 0.4 mg by mouth daily.   traZODone 50 MG tablet Commonly known as: DESYREL Take 50 mg by mouth at bedtime as needed for sleep.   Turmeric 01-999 MG Caps Take 1 capsule by mouth in the morning and at bedtime.    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   HENT: Negative.    Eyes: Negative.   Respiratory: Negative.    Cardiovascular: Negative.   Gastrointestinal: Negative.   Genitourinary: Negative.   Musculoskeletal: Negative.   Skin: Negative.   Neurological: Negative.   Endo/Heme/Allergies: Negative.   Psychiatric/Behavioral: Negative.       Objective:   Vitals:   08/08/23 1612  BP: (!) 148/80  Pulse: (!) 52  Resp: 12  SpO2: 97%   Height: 5' 10.5" (179.1 cm)  Weight: 251 lb 3.2 oz (113.9 kg)   Physical Exam Constitutional:      Appearance: He is not diaphoretic.  HENT:     Head: Normocephalic.     Right Ear: Tympanic membrane, ear canal and  external ear normal.     Left Ear: Tympanic membrane, ear canal and external ear normal.     Nose: Nose normal. No mucosal edema or rhinorrhea.     Mouth/Throat:     Pharynx: Uvula midline. No oropharyngeal exudate.  Eyes:     Conjunctiva/sclera: Conjunctivae normal.  Neck:     Thyroid: No thyromegaly.     Trachea: Trachea normal. No tracheal tenderness or tracheal deviation.  Cardiovascular:     Rate and Rhythm: Normal rate and regular rhythm.     Heart sounds: Normal heart sounds, S1 normal and S2 normal. No murmur heard. Pulmonary:     Effort: No respiratory distress.     Breath sounds: Normal breath sounds. No stridor. No wheezing or rales.  Lymphadenopathy:     Head:     Right side of head: No tonsillar adenopathy.     Left side of head: No tonsillar adenopathy.     Cervical: No cervical adenopathy.  Skin:    Findings: No erythema or rash.     Nails: There is no clubbing.  Neurological:     Mental Status: He is alert.     Diagnostics:    Spirometry was performed and demonstrated an FEV1 of 2.39 at 67 % of predicted.  Assessment and Plan:   1. Not well controlled moderate persistent asthma   2. Perennial  allergic rhinitis   3. Seasonal allergic rhinitis due to pollen   4. LPRD (laryngopharyngeal reflux disease)    1.  Allergen avoidance measures - dust mite, cat, pollen, mold  2.  Treat and prevent inflammation:  A. Ryaltris - 2 sprays each nostril 1-2 times per day B. Symbicort 160 - 2 inhalation 1-2 times per day  3.  Treat and prevent reflux/LPR:  A.  Decrease caffeine and alcohol consumption B.  Replace throat clearing with swallowing/drinking maneuver C.  Omeprazole 40 mg - 1 tablet 1-2 times per day  4.  If needed:  A. Albuterol - 2 inhalations every 6 hours B. OTC antihistamine - Allegra   5.  Consider starting a course of immunotherapy if medical therapy fails  6. Return to clinic in 6 months or earlier if problem  Terry Stanley needs to use some anti-inflammatory medications on a pretty consistent basis for both his upper and lower airway and I have encouraged him to use his Ryaltris and Symbicort at least 1 time per day.  Fortunately, his LPR is under good control while using omeprazole.  If he fails medical therapy he would definitely be a candidate for immunotherapy.  Will see how things go over the course of the next several months.  Laurette Schimke, MD Allergy / Immunology [facility]

## 2023-08-08 NOTE — Patient Instructions (Addendum)
  1.  Allergen avoidance measures - dust mite, cat, pollen, mold  2.  Treat and prevent inflammation:  A. Ryaltris - 2 sprays each nostril 1-2 times per day B. Symbicort 160 - 2 inhalation 1-2 times per day  3.  Treat and prevent reflux/LPR:  A.  Decrease caffeine and alcohol consumption B.  Replace throat clearing with swallowing/drinking maneuver C.  Omeprazole 40 mg - 1 tablet 1-2 times per day  4.  If needed:  A. Albuterol - 2 inhalations every 6 hours B. OTC antihistamine - Allegra   5.  Consider starting a course of immunotherapy if medical therapy fails  6. Return to clinic in 6 months or earlier if problem

## 2023-08-09 ENCOUNTER — Encounter: Payer: Self-pay | Admitting: Allergy and Immunology

## 2023-08-09 DIAGNOSIS — J309 Allergic rhinitis, unspecified: Secondary | ICD-10-CM | POA: Diagnosis not present

## 2023-08-09 DIAGNOSIS — Z6834 Body mass index (BMI) 34.0-34.9, adult: Secondary | ICD-10-CM | POA: Diagnosis not present

## 2023-08-09 DIAGNOSIS — Z7185 Encounter for immunization safety counseling: Secondary | ICD-10-CM | POA: Diagnosis not present

## 2023-08-15 ENCOUNTER — Encounter: Payer: Self-pay | Admitting: Cardiology

## 2023-08-15 ENCOUNTER — Other Ambulatory Visit: Payer: Self-pay | Admitting: Cardiology

## 2023-08-16 DIAGNOSIS — L570 Actinic keratosis: Secondary | ICD-10-CM | POA: Diagnosis not present

## 2023-08-26 DIAGNOSIS — R070 Pain in throat: Secondary | ICD-10-CM | POA: Diagnosis not present

## 2023-09-15 ENCOUNTER — Telehealth: Payer: Self-pay | Admitting: Cardiology

## 2023-09-15 ENCOUNTER — Other Ambulatory Visit: Payer: Self-pay

## 2023-09-15 MED ORDER — METOPROLOL SUCCINATE ER 50 MG PO TB24: 50.0000 mg | ORAL_TABLET | Freq: Every day | ORAL | 3 refills | Status: DC

## 2023-09-15 NOTE — Telephone Encounter (Signed)
 Called the patient and informed him of Dr. Karry recommendation below:  Stop Bystolic  and start metoprolol  succinate 50 daily   Patient verbalized understanding and had no further questions at this time. Metoprolol  succinate was ordered via Epic and sent to the patient's pharmacy.

## 2023-09-15 NOTE — Telephone Encounter (Signed)
 Called patient and he reported that he recently started taking Bystolic  about 1 month ago. Now he has developed a rash all over his torso that is very itchy. He has started taking Benadryl to help with the rash. He is asking if there is another medication that can be prescribed that works like Bystolic  and also if a medication can be called in to help with the rash?

## 2023-09-15 NOTE — Telephone Encounter (Signed)
 Pt c/o medication issue:  1. Name of Medication: nebivolol  (BYSTOLIC ) 10 MG tablet   2. How are you currently taking this medication (dosage and times per day)? As prescribed   3. Are you having a reaction (difficulty breathing--STAT)? Yes   4. What is your medication issue? Patient is requesting call back to discuss rash on torso area that he believes may be caused by this medication. Requesting call back to discuss further.

## 2023-09-27 DIAGNOSIS — M47812 Spondylosis without myelopathy or radiculopathy, cervical region: Secondary | ICD-10-CM | POA: Diagnosis not present

## 2023-09-27 DIAGNOSIS — M542 Cervicalgia: Secondary | ICD-10-CM | POA: Diagnosis not present

## 2023-09-30 ENCOUNTER — Other Ambulatory Visit: Payer: Self-pay | Admitting: Allergy and Immunology

## 2023-11-07 DIAGNOSIS — Z79891 Long term (current) use of opiate analgesic: Secondary | ICD-10-CM | POA: Diagnosis not present

## 2023-11-07 DIAGNOSIS — M542 Cervicalgia: Secondary | ICD-10-CM | POA: Diagnosis not present

## 2023-11-07 DIAGNOSIS — G894 Chronic pain syndrome: Secondary | ICD-10-CM | POA: Diagnosis not present

## 2023-11-07 DIAGNOSIS — Z79899 Other long term (current) drug therapy: Secondary | ICD-10-CM | POA: Diagnosis not present

## 2023-11-07 DIAGNOSIS — M47812 Spondylosis without myelopathy or radiculopathy, cervical region: Secondary | ICD-10-CM | POA: Diagnosis not present

## 2023-11-16 ENCOUNTER — Other Ambulatory Visit: Payer: Self-pay | Admitting: Cardiology

## 2023-12-03 ENCOUNTER — Other Ambulatory Visit: Payer: Self-pay | Admitting: Allergy and Immunology

## 2023-12-06 DIAGNOSIS — Z23 Encounter for immunization: Secondary | ICD-10-CM | POA: Diagnosis not present

## 2023-12-06 DIAGNOSIS — E669 Obesity, unspecified: Secondary | ICD-10-CM | POA: Diagnosis not present

## 2023-12-06 DIAGNOSIS — Z6834 Body mass index (BMI) 34.0-34.9, adult: Secondary | ICD-10-CM | POA: Diagnosis not present

## 2023-12-20 DIAGNOSIS — M25572 Pain in left ankle and joints of left foot: Secondary | ICD-10-CM | POA: Diagnosis not present

## 2023-12-22 DIAGNOSIS — L820 Inflamed seborrheic keratosis: Secondary | ICD-10-CM | POA: Diagnosis not present

## 2023-12-22 DIAGNOSIS — L814 Other melanin hyperpigmentation: Secondary | ICD-10-CM | POA: Diagnosis not present

## 2023-12-22 DIAGNOSIS — D225 Melanocytic nevi of trunk: Secondary | ICD-10-CM | POA: Diagnosis not present

## 2023-12-22 DIAGNOSIS — L570 Actinic keratosis: Secondary | ICD-10-CM | POA: Diagnosis not present

## 2023-12-22 DIAGNOSIS — L821 Other seborrheic keratosis: Secondary | ICD-10-CM | POA: Diagnosis not present

## 2023-12-22 DIAGNOSIS — D2239 Melanocytic nevi of other parts of face: Secondary | ICD-10-CM | POA: Diagnosis not present

## 2023-12-24 DIAGNOSIS — R0981 Nasal congestion: Secondary | ICD-10-CM | POA: Diagnosis not present

## 2023-12-24 DIAGNOSIS — R059 Cough, unspecified: Secondary | ICD-10-CM | POA: Diagnosis not present

## 2023-12-28 DIAGNOSIS — E669 Obesity, unspecified: Secondary | ICD-10-CM | POA: Diagnosis not present

## 2023-12-28 DIAGNOSIS — Z6833 Body mass index (BMI) 33.0-33.9, adult: Secondary | ICD-10-CM | POA: Diagnosis not present

## 2023-12-28 DIAGNOSIS — J329 Chronic sinusitis, unspecified: Secondary | ICD-10-CM | POA: Diagnosis not present

## 2024-01-09 DIAGNOSIS — E669 Obesity, unspecified: Secondary | ICD-10-CM | POA: Diagnosis not present

## 2024-01-09 DIAGNOSIS — K5900 Constipation, unspecified: Secondary | ICD-10-CM | POA: Diagnosis not present

## 2024-01-09 DIAGNOSIS — J329 Chronic sinusitis, unspecified: Secondary | ICD-10-CM | POA: Diagnosis not present

## 2024-01-09 DIAGNOSIS — Z6832 Body mass index (BMI) 32.0-32.9, adult: Secondary | ICD-10-CM | POA: Diagnosis not present

## 2024-01-13 ENCOUNTER — Encounter (HOSPITAL_BASED_OUTPATIENT_CLINIC_OR_DEPARTMENT_OTHER): Payer: Self-pay

## 2024-01-13 ENCOUNTER — Ambulatory Visit (HOSPITAL_BASED_OUTPATIENT_CLINIC_OR_DEPARTMENT_OTHER): Admission: EM | Admit: 2024-01-13 | Discharge: 2024-01-13 | Disposition: A | Discharge status: Home / Self Care

## 2024-01-13 DIAGNOSIS — I951 Orthostatic hypotension: Secondary | ICD-10-CM

## 2024-01-13 NOTE — ED Triage Notes (Signed)
 States started [place] mid April. States was doing well and then became sick with an infection. Prescribed an antibiotic and experienced a lot of vomiting. Antibiotic was changed to Amoxicillin. Patient now having dizziness, dramatic drops in blood pressure. Has also stopped sugar, stopped alcohol, patient does not drink daily. Also experiencing constipation. Unsure if his symptoms are related to [facility] or something else.

## 2024-01-13 NOTE — Discharge Instructions (Signed)
 I believe that your dizziness came from your blood pressure being too low. You may need to have this adjusted since you have  lost weight and changed your lifestyle. Also being sick and taking antibiotics may have contributed.  Make sure that you are drinking fluids and staying hydrated. Electrolytes. Make sure you are eating every 3-4 hours to keep blood sugars stable. Follow up with your doctor for adjustment of your BP medications. Keep track of your blood pressures at home Follow up as needed.

## 2024-01-13 NOTE — ED Provider Notes (Signed)
 [facility]    CSN: [id number] Arrival date & time: 01/13/24  1110      History   Chief Complaint Chief Complaint  Patient presents with   Dizziness    HPI Terry Stanley is a 63 y.o. male.   Patient is a 63 year old male who presents today with onset of dizziness, lightheadedness and low blood pressure.  This occurred this morning.  He was at work when this happened.  Prior to this he was at home where he took his blood pressure medication and then about an hour to hour and a half later got to work and ate a protein bar.  He was reaching up messing with some boxes and then became lightheaded and had to sit down.  Reports that some coworkers told him he looked very pale.  He then proceeded to go sit in his car and get some fresh air and decided to go home where his wife took his blood pressure that was around 96/80.  He rested and put his feet up and then took his blood pressure again which had increased to 120 systolic.  He was feeling better at this time.  He has had no chest pain, shortness of breath or palpitations.  He most recently has lost about 20 pounds on [place]gov[place] and cut out unhealthy foods from his diet.  He is on multiple blood pressure medications from his cardiologist. He has also had a sinus infection and currently being treated with amoxicillin.  Previously was taken Levaquin which made him very sick   Dizziness   Past Medical History:  Diagnosis Date   Asthma 05/26/2017   Cervical radiculopathy due to degenerative joint disease of spine 06/12/2019   Cervical radiculopathy due to degenerative joint disease of spine 06/12/2019   Edema 04/07/2018   Elevated coronary artery calcium  score 08/01/2019   Essential hypertension 08/23/2016   Hyperlipidemia 10/15/2011   Low back pain 10/15/2011   Neoplasm of skin of upper limb or shoulder, benign 01/30/2016   PONV (postoperative nausea and vomiting) 05/26/2017   Resistant hypertension 08/23/2016   Spinal stenosis of lumbar  region 10/15/2011   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   Thoracic or lumbosacral neuritis or radiculitis 10/15/2011    Patient Active Problem List   Diagnosis Date Noted   Left sided sciatica 06/02/2022   CAD (coronary artery disease) 05/07/2022   Elevated coronary artery calcium  score 08/01/2019   Asthma 05/26/2017   PONV (postoperative nausea and vomiting) 05/26/2017   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   Resistant hypertension 08/23/2016   Essential hypertension 08/23/2016   Pre-operative cardiovascular examination 08/23/2016   Neoplasm of skin of upper limb or shoulder, benign 01/30/2016   Hyperlipidemia 10/15/2011   Low back pain 10/15/2011   Spinal stenosis of lumbar region 10/15/2011   Thoracic or lumbosacral neuritis or radiculitis 10/15/2011    Past Surgical History:  Procedure Laterality Date   APPENDECTOMY     BACK SURGERY  07/05/2022   Fusion L5 and S1   CARPAL TUNNEL RELEASE     NECK SURGERY  2017   fatty tumer removed   NECK SURGERY  2020   neck fusion C4-C7   thumb surgery Right        Home Medications    Prior to Admission medications   Medication Sig Start Date End Date Taking? Authorizing Provider  albuterol  (VENTOLIN  HFA) 108 (90 Base) MCG/ACT inhaler CAN INHALE TWO PUFFS EVERY SIX HOURS IF NEEDED FOR COUGH, WHEEZE, SHORTNESS OF BREATH.  09/30/23   Kozlow, Rema Care, MD  Albuterol  Sulfate, sensor, (PROAIR  DIGIHALER) 108 (90 Base) MCG/ACT AEPB 2 inhalations every 4-6 hours as needed Patient taking differently: Inhale 2 puffs into the lungs every 4 (four) hours as needed (SOB wheezing). 2 inhalations every 4-6 hours as needed 09/16/21   Kozlow, Eric J, MD  ALPRAZolam (XANAX) 1 MG tablet Take 0.5-1 mg by mouth daily as needed for anxiety or sleep. 08/05/21   [provider]  atorvastatin  (LIPITOR) 40 MG tablet Take 1 tablet (40 mg total) by mouth daily. 11/16/23   Hassan Links, MD  budesonide -formoterol  (SYMBICORT ) 160-4.5 MCG/ACT inhaler Inhale  two puffs one to two times daily to prevent cough or wheeze.  Rinse, gargle, and spit after use. 08/08/23   Kozlow, Rema Care, MD  cloNIDine  (CATAPRES ) 0.1 MG tablet Take 1 tablet (0.1 mg total) by mouth 2 (two) times daily. 08/15/23   Hassan Links, MD  Coenzyme Q10 (CO Q 10 PO) Take 1 tablet by mouth at bedtime. Patient not taking: Reported on 08/08/2023    [provider]  Ergocalciferol  (VITAMIN D2 PO) Take by mouth daily.    [provider]  hydrALAZINE  (APRESOLINE ) 50 MG tablet Take 1 tablet (50 mg total) by mouth 2 (two) times daily. 06/09/23   Hassan Links, MD  hydrochlorothiazide  (HYDRODIURIL ) 12.5 MG tablet Take 1 tablet (12.5 mg total) by mouth daily. 11/16/23   Hassan Links, MD  HYDROcodone-acetaminophen (NORCO) 10-325 MG tablet Take 1 tablet by mouth every 6 (six) hours as needed for moderate pain. 06/21/17   [provider]  ibuprofen (ADVIL,MOTRIN) 200 MG tablet Take 600 mg by mouth every 6 (six) hours as needed for mild pain or moderate pain. 01/30/16   [provider]  ipratropium (ATROVENT ) 0.06 % nasal spray CAN USE TWO SPRAYS IN EACH NOSTRIL EVERY 6 HOURS AS NEEDED TO DRY NOSE. 12/05/23   Kozlow, Rema Care, MD  methocarbamol  (ROBAXIN ) 750 MG tablet Take 750 mg by mouth every 8 (eight) hours as needed for muscle spasms.    [provider]  metoprolol  succinate (TOPROL -XL) 50 MG 24 hr tablet Take 1 tablet (50 mg total) by mouth daily. Take with or immediately following a meal. 09/15/23   Manfred Seed, MD  nitroGLYCERIN  (NITROSTAT ) 0.4 MG SL tablet Place 1 tablet (0.4 mg total) under the tongue every 5 (five) minutes as needed for chest pain. 11/18/21 08/08/23  Hassan Links, MD  olmesartan  (BENICAR ) 40 MG tablet TAKE 1 TABLET BY MOUTH EVERY DAY 05/04/23   Hassan Links, MD  Omega-3 Fatty Acids (FISH OIL) 1000 MG CAPS Take 1 capsule by mouth daily.    [provider]  omeprazole  (PRILOSEC) 20 MG capsule Take 20 mg by mouth 2 (two)  times daily. 07/19/23   [provider]  ondansetron  (ZOFRAN -ODT) 4 MG disintegrating tablet Take 4-8 mg by mouth every 8 (eight) hours as needed for nausea or vomiting. Patient not taking: Reported on 08/08/2023 05/18/23   [provider]  PROCTOFOAM [facility] rectal foam Place 1 applicator rectally 3 (three) times daily. 05/11/23   [provider]  RYALTRIS  322-02 MCG/ACT SUSP Use two sprays in each nostril one to two times per day. 08/08/23   Kozlow, Rema Care, MD  tadalafil (CIALIS) 10 MG tablet Take 10 mg by mouth daily. 07/05/23   [provider]  tamsulosin (FLOMAX) 0.4 MG CAPS capsule Take 0.4 mg by mouth daily. 09/15/18   [provider]  traZODone (DESYREL) 50 MG tablet Take 50 mg by mouth at bedtime as needed for sleep. 10/25/22   [provider]  Turmeric 01-999 MG CAPS Take 1 capsule by mouth in the morning and at bedtime.    [provider]  vitamin C (ASCORBIC ACID) 500 MG tablet Take 500 mg by mouth daily.     [provider]    Family History Family History  Problem Relation Age of Onset   Angina Mother    Hypertension Brother    Colon cancer Neg Hx    Colon polyps Neg Hx    Esophageal cancer Neg Hx    Rectal cancer Neg Hx    Stomach cancer Neg Hx     Social History Social History   Tobacco Use   Smoking status: Former    Types: Cigarettes   Smokeless tobacco: Never  Vaping Use   Vaping status: Never Used  Substance Use Topics   Alcohol use: Yes    Alcohol/week: 12.0 standard drinks of alcohol    Types: 12 Cans of beer per week    Comment: a week   Drug use: No     Allergies   Bee venom and Carvedilol   Review of Systems Review of Systems  Neurological:  Positive for dizziness.    See HPI Physical Exam Triage Vital Signs ED Triage Vitals  Encounter Vitals Group     BP 01/13/24 1134 118/78     Systolic BP Percentile --      Diastolic BP Percentile --      Pulse Rate 01/13/24 1134 (!) 59      Resp 01/13/24 1134 20     Temp 01/13/24 1134 98.3 F (36.8 C)     Temp Source 01/13/24 1134 Oral     SpO2 01/13/24 1134 96 %     Weight --      Height --      Head Circumference --      Peak Flow --      Pain Score 01/13/24 1135 0     Pain Loc --      Pain Education --      Exclude from Growth Chart --    No data found.  Updated Vital Signs BP 118/78 (BP Location: Right Arm)   Pulse (!) 59   Temp 98.3 F (36.8 C) (Oral)   Resp 20   SpO2 96%   Visual Acuity Right Eye Distance:   Left Eye Distance:   Bilateral Distance:    Right Eye Near:   Left Eye Near:    Bilateral Near:     Physical Exam Vitals and nursing note reviewed.  Constitutional:      General: He is not in acute distress.    Appearance: Normal appearance. He is not ill-appearing, toxic-appearing or diaphoretic.  HENT:     Right Ear: Tympanic membrane normal.     Left Ear: Tympanic membrane, ear canal and external ear normal.     Ears:     Comments: Small effusion in the right ear     Nose: Congestion present.  Eyes:     Extraocular Movements: Extraocular movements intact.     Conjunctiva/sclera: Conjunctivae normal.     Pupils: Pupils are equal, round, and reactive to light.  Cardiovascular:     Rate and Rhythm: Normal rate and regular rhythm.     Pulses: Normal pulses.     Heart sounds: Normal heart sounds.  Pulmonary:     Effort:  Pulmonary effort is normal.     Breath sounds: Normal breath sounds.  Musculoskeletal:        General: Normal range of motion.  Skin:    General: Skin is warm and dry.  Neurological:     General: No focal deficit present.     Mental Status: He is alert.     Cranial Nerves: No dysarthria or facial asymmetry.     Motor: No weakness.  Psychiatric:        Mood and Affect: Mood normal.      UC Treatments / Results  Labs (all labs ordered are listed, but only abnormal results are displayed) Labs Reviewed - No data to display  EKG   Radiology No results  found.  Procedures Procedures (including critical care time)  Medications Ordered in UC Medications - No data to display  Initial Impression / Assessment and Plan / UC Course  I have reviewed the triage vital signs and the nursing notes.  Pertinent labs & imaging results that were available during my care of the patient were reviewed by me and considered in my medical decision making (see chart for details).     Orthostatic hypotension-I believe that he became dizzy this morning because his blood pressure had dropped too low.  This was worse with position change.  The problem has seemed to resolve and he is feeling well currently.  Blood pressure in normal range at 118/78.  Recommended follow-up with his cardiologist for potential change in medications.  Since he has had that 20 pound weight loss this may have decreased his blood pressure naturally and he may not need as much medication anymore.  He may be taking too much blood pressure medication which could be causing the hypotension.  Recommended to keep an eye on his blood pressures and keep checking them daily.  Make sure he is drinking plenty of fluids and staying hydrated. Follow-up as needed Final Clinical Impressions(s) / UC Diagnoses   Final diagnoses:  Orthostatic hypotension     Discharge Instructions      I believe that your dizziness came from your blood pressure being too low. You may need to have this adjusted since you have  lost weight and changed your lifestyle. Also being sick and taking antibiotics may have contributed.  Make sure that you are drinking fluids and staying hydrated. Electrolytes. Make sure you are eating every 3-4 hours to keep blood sugars stable. Follow up with your doctor for adjustment of your BP medications. Keep track of your blood pressures at home Follow up as needed.   ED Prescriptions   None    PDMP not reviewed this encounter.   Landa Pine, FNP 01/13/24 [phone]

## 2024-01-14 DIAGNOSIS — I959 Hypotension, unspecified: Secondary | ICD-10-CM | POA: Diagnosis not present

## 2024-01-14 DIAGNOSIS — R300 Dysuria: Secondary | ICD-10-CM | POA: Diagnosis not present

## 2024-01-14 DIAGNOSIS — E038 Other specified hypothyroidism: Secondary | ICD-10-CM | POA: Diagnosis not present

## 2024-01-14 DIAGNOSIS — R3589 Other polyuria: Secondary | ICD-10-CM | POA: Diagnosis not present

## 2024-01-14 DIAGNOSIS — R5383 Other fatigue: Secondary | ICD-10-CM | POA: Diagnosis not present

## 2024-01-18 ENCOUNTER — Other Ambulatory Visit: Payer: Self-pay

## 2024-01-23 DIAGNOSIS — M545 Low back pain, unspecified: Secondary | ICD-10-CM | POA: Diagnosis not present

## 2024-01-23 DIAGNOSIS — Z981 Arthrodesis status: Secondary | ICD-10-CM | POA: Diagnosis not present

## 2024-01-23 DIAGNOSIS — Z1330 Encounter for screening examination for mental health and behavioral disorders, unspecified: Secondary | ICD-10-CM | POA: Diagnosis not present

## 2024-01-23 DIAGNOSIS — G894 Chronic pain syndrome: Secondary | ICD-10-CM | POA: Diagnosis not present

## 2024-01-23 NOTE — Progress Notes (Signed)
 Cardiology Office Note:  .   Date:  01/24/2024  ID:  Terry Stanley, DOB 1961/01/30, MRN [id number] PCP: Street, Renford Cartwright, MD  [facility] Providers Cardiologist:  Zoe Hinds, MD    History of Present Illness: .   Terry Stanley is a 63 y.o. male with a past medical history of nonobstructive CAD, hypertension, dyslipidemia.   05/19/2022 echo EF 60 to 65%, mild MR, aortic valve sclerosis present without stenosis 05/11/2022 Lexiscan  normal, low risk, EF 57% 08/01/2019 coronary CTA calcium  score 344, 89th percentile, FFR negative for any significant stenosis  [facility] with [facility] 2017 for the evaluation and management of his hypertension.  In 2020 he underwent a coronary CTA revealing a calcium  score 344, 89 percentile.  Most recently was evaluated by Dr. Sandee Crook on 07/04/2023 for the evaluation of pedal edema, this had apparently subsided by the time he was in the office and was felt to be related to steroid therapy and use of Lyrica.  No changes were made to his plan of care as advised follow-up in 6 months.  He presents today for follow up of his CAD. He has been doing well, he has made significant changes to his dietary intake and has lost ~ 20 lbs. He is feeling good, encouraged to make further changes.  He had noticed that his blood pressure been dropping, he had also been symptomatic, blood pressure was in the 80s systolic, he went to urgent care and they discontinued his clonidine .  He wants to do a trial of stopping his statin to see if his dietary changes will affect that. 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 He denies chest pain, palpitations, dyspnea, pnd, orthopnea, n, v, dizziness, syncope, edema, weight gain, or early satiety.   ROS: Review of Systems  All other systems reviewed and are negative.    Studies Reviewed: .        Cardiac Studies & Procedures    ______________________________________________________________________________________________   STRESS TESTS  MYOCARDIAL PERFUSION IMAGING 05/11/2022  Narrative   The study is normal. The study is low risk.   Left ventricular function is normal. Nuclear stress EF: 57 %. The left ventricular ejection fraction is normal (55-65%). End diastolic cavity size is normal.   Prior study not available for comparison.   ECHOCARDIOGRAM  ECHOCARDIOGRAM COMPLETE 05/19/2022  Narrative ECHOCARDIOGRAM REPORT    Patient Name:   Terry Stanley Date of Exam: 05/19/2022 Medical Rec #:  [id number]     Height:       72.0 in Accession #:    [id number]    Weight:       245.0 lb Date of Birth:  08-19-1961     BSA:          2.322 m Patient Age:    60 years      BP:           110/70 mmHg Patient Gender: M             HR:           58 bpm. Exam Location:  [place]  Procedure: 2D Echo, Cardiac Doppler, Color Doppler and Strain Analysis  Indications:    Coronary artery disease involving native coronary artery of native heart without angina pectoris [I25.10 (ICD-10-CM)]; Mixed hyperlipidemia [E78.2 (ICD-10-CM)]; Essential hypertension [I10 (ICD-10-CM)]; Pre-operative cardiovascular examination [Z01.810 (ICD-10-CM)]  History:        Patient has no prior history of  Echocardiogram examinations. CAD; Risk Factors:Hypertension and Dyslipidemia.  Sonographer:    Erminia Hazel RDCS Referring Phys: Pollie Bring [facility]  IMPRESSIONS   1. Left ventricular ejection fraction, by estimation, is 60 to 65%. The left ventricle has normal function. The left ventricle has no regional wall motion abnormalities. Left ventricular diastolic parameters were normal. 2. Right ventricular systolic function is normal. The right ventricular size is normal. 3. Left atrial size was mildly dilated. 4. The mitral valve is normal in structure. Mild mitral valve regurgitation. No evidence of mitral stenosis. 5. The aortic valve is  normal in structure. Aortic valve regurgitation is not visualized. Aortic valve sclerosis is present, with no evidence of aortic valve stenosis. 6. The inferior vena cava is normal in size with greater than 50% respiratory variability, suggesting right atrial pressure of 3 mmHg.  FINDINGS Left Ventricle: Left ventricular ejection fraction, by estimation, is 60 to 65%. The left ventricle has normal function. The left ventricle has no regional wall motion abnormalities. The left ventricular internal cavity size was normal in size. There is no left ventricular hypertrophy. Left ventricular diastolic parameters were normal.  Right Ventricle: The right ventricular size is normal. No increase in right ventricular wall thickness. Right ventricular systolic function is normal.  Left Atrium: Left atrial size was mildly dilated.  Right Atrium: Right atrial size was normal in size.  Pericardium: There is no evidence of pericardial effusion.  Mitral Valve: The mitral valve is normal in structure. Mild mitral valve regurgitation. No evidence of mitral valve stenosis.  Tricuspid Valve: The tricuspid valve is normal in structure. Tricuspid valve regurgitation is not demonstrated. No evidence of tricuspid stenosis.  Aortic Valve: The aortic valve is normal in structure. Aortic valve regurgitation is not visualized. Aortic valve sclerosis is present, with no evidence of aortic valve stenosis.  Pulmonic Valve: The pulmonic valve was normal in structure. Pulmonic valve regurgitation is not visualized. No evidence of pulmonic stenosis.  Aorta: The aortic root is normal in size and structure.  Venous: The inferior vena cava is normal in size with greater than 50% respiratory variability, suggesting right atrial pressure of 3 mmHg.  IAS/Shunts: No atrial level shunt detected by color flow Doppler.   LEFT VENTRICLE PLAX 2D LVIDd:         5.20 cm   Diastology LVIDs:         3.30 cm   LV e' medial:    7.29  cm/s LV PW:         1.10 cm   LV E/e' medial:  9.6 LV IVS:        1.40 cm   LV e' lateral:   9.25 cm/s LVOT diam:     2.20 cm   LV E/e' lateral: 7.6 LV SV:         104 LV SV Index:   45 LVOT Area:     3.80 cm   RIGHT VENTRICLE             IVC RV Basal diam:  3.00 cm     IVC diam: 2.00 cm RV S prime:     12.50 cm/s TAPSE (M-mode): 2.6 cm  LEFT ATRIUM             Index        RIGHT ATRIUM           Index LA diam:        4.80 cm 2.07 cm/m   RA Area:  19.20 cm LA Vol (A2C):   93.5 ml 40.26 ml/m  RA Volume:   53.30 ml  22.95 ml/m LA Vol (A4C):   66.2 ml 28.50 ml/m LA Biplane Vol: 79.6 ml 34.27 ml/m AORTIC VALVE LVOT Vmax:   96.60 cm/s LVOT Vmean:  71.300 cm/s LVOT VTI:    0.273 m  AORTA Ao Root diam: 3.30 cm Ao Asc diam:  3.50 cm Ao Desc diam: 2.40 cm  MITRAL VALVE MV Area (PHT): 3.99 cm    SHUNTS MV Decel Time: 190 msec    Systemic VTI:  0.27 m MR Peak grad: 109.8 mmHg   Systemic Diam: 2.20 cm MR Vmax:      524.00 cm/s MV E velocity: 70.30 cm/s MV A velocity: 55.70 cm/s MV E/A ratio:  1.26  Ralene Burger MD Electronically signed by Ralene Burger MD Signature Date/Time: 05/19/2022/12:21:26 PM    Final      CT SCANS  CT CORONARY MORPH W/CTA COR W/SCORE 11/14/2019  Addendum 11/14/2019  7:44 PM ADDENDUM REPORT: 11/14/2019 19:42  HISTORY: 63 yo male CAD eval, high risk, asymptomatic, prior calcium  score 100-400  EXAM: Cardiac/Coronary CTA  TECHNIQUE: The patient was scanned on a [employer].  PROTOCOL: A 120 kV prospective scan was triggered in the descending thoracic aorta at 111 HU's. Axial non-contrast 3 mm slices were carried out through the heart. The data set was analyzed on a dedicated work station and scored using the Agatson method. Gantry rotation speed was 250 msecs and collimation was .6 mm. Beta blockade and 0.8 mg of sl NTG was given. The 3D data set was reconstructed in 5% intervals of the 67-82 % of the R-R cycle.  Diastolic phases were analyzed on a dedicated work station using MPR, MIP and VRT modes. The patient received 80mL OMNIPAQUE  IOHEXOL  350 MG/ML SOLN of contrast.  FINDINGS: Quality: Excellent (HR 52)  Coronary calcium  score: The patient's coronary artery calcium  score is 344, which places the patient in the 89th percentile.  Coronary arteries: Normal coronary origins.  Right dominance.  Right Coronary Artery: Dominant vessel. Gives of the R-PDA and R-PLB branches. Minimal 1-24% proximal mixed stenosis (CADRADS1). There is moderate diffuse mixed 50-69% (CADRADS3) mid vessel stenosis that is not likely flow-limiting. Minimal mixed proximal R-PLB branch stenosis (CADRADS1).  Left Main Coronary Artery: Normal left main. Bifurcates into the LAD and LCx arteries.  Left Anterior Descending Coronary Artery: Mild mixed 25-49% ostial LAD stenosis (CADRADS 2). Proximal minimal 1-24% mixed stenosis (CADRADS1) with mild non-calcified diffuse distal disease. There are 2 smaller proximal diagonal vessels without stenosis.  Left Circumflex Artery: Minimal 1-24% mixed proximal stenosis (CADRADS1). The artery trifurcates, giving off a moderate-sized OM branch with mild 25-49% disease (CADRADS2) and a smaller OM2 branch without disease.  Aorta: Normal size, 33 mm at the mid ascending aorta (level of the PA bifurcation) measured double oblique. Aortic atherosclerosis. No dissection.  Aortic Valve: Tricuspid.  No calcifications.  Other findings:  Normal pulmonary vein drainage into the left atrium.  Normal left atrial appendage without a thrombus.  Dilated main pulmonary artery at 30 mm, suggestive of pulmonary hypertension.  IMPRESSION: 1. Mild to moderate multivessel mixed CAD, worse in the RCA, CADRADS = 3, likely non-obstructive. CT FFR will be performed and reported separately.  2. Coronary calcium  score of 344. This was 89th percentile for age and sex matched control. Multivessel  CAC noted.  3. Normal coronary origin with right dominance.  4.  Dilated main PA at 30 mm, suggestive  of pulmonary hypertension.  5. Aggressive cardiovascular risk factor modification is encouraged.   Electronically Signed By: Hazle Lites M.D. On: 11/14/2019 19:42  Narrative EXAM: OVER-READ INTERPRETATION  CT CHEST  The following report is an over-read performed by radiologist Dr. Janeece Mechanic of [facility] on 11/14/2019. This over-read does not include interpretation of cardiac or coronary anatomy or pathology. The coronary CTA interpretation by the cardiologist is attached.  COMPARISON:  None.  FINDINGS: Vascular: Heart is normal size.  Visualized aorta normal caliber.  Mediastinum/Nodes: No adenopathy in the lower mediastinum or hila.  Lungs/Pleura: Small calcified granulomas in the right lung. Noncalcified small peripheral nodules in the right lower lobe, the largest 6-7 mm on image 17. Adjacent smaller nodule peripherally on image 22. No confluent opacities or effusions.  Upper Abdomen: Imaging into the upper abdomen shows no acute findings.  Musculoskeletal: Chest wall soft tissues are unremarkable. No acute bony abnormality.  IMPRESSION: Old granulomatous disease.  Small subpleural nodules in the right lower lobe posteriorly, the largest 6-7 mm. Non-contrast chest CT at 3-6 months is recommended. If the nodules are stable at time of repeat CT, then future CT at 18-24 months (from today's scan) is considered optional for low-risk patients, but is recommended for high-risk patients. This recommendation follows the consensus statement: Guidelines for Management of Incidental Pulmonary Nodules Detected on CT Images: From the Fleischner Society 2017; Radiology 2017; 284:228-243.  Electronically Signed: By: Janeece Mechanic M.D. On: 11/14/2019 08:39   CT CORONARY FRACTIONAL FLOW RESERVE DATA PREP 11/14/2019  Narrative EXAM: CT FFR  ANALYSIS  CLINICAL DATA:  CAD eval, high risk, asymptomatic, prior calcium  score 100-400 abnormal calcium  scoring  FINDINGS: FFRct analysis was performed on the original cardiac CT angiogram dataset. Diagrammatic representation of the FFRct analysis is provided in a separate PDF document in PACS. This dictation was created using the PDF document and an interactive 3D model of the results. 3D model is not available in the EMR/PACS. Normal FFR range is >0.80.  1. Left Main:  No significant stenosis. FFR = 1.00  2. LAD: No significant stenosis. Proximal FFR = 0.94, Mid FFR = 0.94, Distal FFR = 0.79 (very small vessel at this point) 3. LCX: No significant stenosis. Proximal FFR = 0.95, Distal FFR = 0.82 4. RCA: No significant stenosis. Proximal FFR = 1.00, Mid FFR = 0.97, Distal FFR = 0.92  IMPRESSION: 1.  CT FFR analysis did not show any significant stenosis.   Electronically Signed By: Hazle Lites M.D. On: 11/15/2019 09:44   CT SCANS  CT CARDIAC SCORING (SELF PAY ONLY) 07/26/2019  Addendum 07/26/2019  4:05 PM ADDENDUM REPORT: 07/26/2019 15:36  ADDENDUM: OVER-READ INTERPRETATION  CT CHEST  The following report is an over-read performed by radiologist Dr. Fate Honor [facility] on 07/26/2019. This over-read does not include interpretation of cardiac or coronary anatomy or pathology. The calcium  score interpretation by the cardiologist is attached.  COMPARISON:  None.  FINDINGS:  Vascular: Aortic atherosclerosis.  Mediastinum/Nodes: No imaged thoracic adenopathy.  Lungs/Pleura: No imaged pleural fluid. Mild centrilobular emphysema. Tiny well-circumscribed pulmonary nodules, the majority of which are felt to be calcified bilaterally. A right lower lobe subpleural pulmonary nodule of 4 mm on 30/3 is not calcified.  Upper Abdomen: Normal imaged portions of the liver, stomach.  Musculoskeletal: No acute osseous abnormality. Lower  thoracic spondylosis.  IMPRESSION:  1.  No acute findings in the imaged extracardiac chest. 2.  Emphysema (ICD10-J43.9). 3. Multiple pulmonary nodules, primarily calcified  granulomas. Right lower lobe pulmonary nodule of 4 mm is not calcified. Non-contrast chest CT can be considered in 12 months, given risk factors for primary bronchogenic carcinoma. This recommendation follows the consensus statement: Guidelines for Management of Incidental Pulmonary Nodules Detected on CT Images: From the Fleischner Society 2017; Radiology 2017; 284:228-243.   Electronically Signed By: Lore Rode M.D. On: 07/26/2019 15:36  Narrative CLINICAL DATA:  Risk stratification  EXAM: Coronary Calcium  Score  TECHNIQUE: The patient was scanned on a [employer]. Axial non-contrast 3 mm slices were carried out through the heart. The data set was analyzed on a dedicated work station and scored using the Agatson method.  FINDINGS: Non-cardiac: See separate report from [facility].  Ascending Aorta: Normal Caliber.  Calcifications noted.  Pericardium: Normal  Coronary arteries: Normal coronary origins. Coronary calcifications in the LAD, RCA and LCx.  IMPRESSION: Coronary calcium  score of 300. This was 88th percentile for age and sex matched control.  Traci Turner  Electronically Signed: By: Gaylyn Keas On: 07/26/2019 10:53     ______________________________________________________________________________________________      Risk Assessment/Calculations:            Physical Exam:   VS:  BP 128/76   Pulse 76   Ht 6' (1.829 m)   Wt 236 lb 3.2 oz (107.1 kg)   SpO2 96%   BMI 32.03 kg/m    Wt Readings from Last 3 Encounters:  01/24/24 236 lb 3.2 oz (107.1 kg)  08/08/23 251 lb 3.2 oz (113.9 kg)  07/04/23 248 lb (112.5 kg)    GEN: Well nourished, well developed in no acute distress NECK: No JVD; No carotid bruits CARDIAC: RRR, no murmurs, rubs,  gallops RESPIRATORY:  Clear to auscultation without rales, wheezing or rhonchi  ABDOMEN: Soft, non-tender, non-distended EXTREMITIES:  No edema; No deformity   ASSESSMENT AND PLAN: .   CAD -nonobstructive per coronary CTA. Stable with no anginal symptoms. No indication for ischemic evaluation. Heart healthy diet and regular cardiovascular exercise encouraged.  He questions whether he has carotid artery stenosis, no bruits were appreciated, but will arrange carotid ultrasound.   Hypertension - BP well controlled at 128/76, clonidine  been discontinued appropriately by urgent care, continue Apresoline  50 mg twice daily, continue HCTZ 12.5 mg daily, continue Benicar  40 mg daily.  I advised him if he continues to lose weight and is noticed that his blood pressure is dropping--his wife is a retired Designer, jewellery, to contact our office and we can make further recommendations on augmenting his medication.  Dyslipidemia-he is self discontinued his Lipitor a few weeks ago and replace it with fish oil, he would like to see if he can make healthy lifestyle changes.  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  Will plan to repeat FLP LFTs as well as LP(a), he is agreeable to restarting statin therapy if his numbers are not acceptable after he is given an opportunity to make further significant lifestyle changes.  Will plan on likely restarting him on Crestor, or possibly Nexlizet if his cholesterol is not well-controlled.  Obesity - BMI 32, Heart healthy diet and regular cardiovascular exercise encouraged.         Dispo: FLP, LP(a), c-Met in 3 months, follow-up in 6 months, carotid duplex.  Signed, Terrance Ferretti, NP

## 2024-01-24 ENCOUNTER — Ambulatory Visit: Attending: Cardiology | Admitting: Cardiology

## 2024-01-24 ENCOUNTER — Encounter: Payer: Self-pay | Admitting: Cardiology

## 2024-01-24 VITALS — BP 128/76 | HR 76 | Ht 72.0 in | Wt 236.2 lb

## 2024-01-24 DIAGNOSIS — E66812 Obesity, class 2: Secondary | ICD-10-CM

## 2024-01-24 DIAGNOSIS — E782 Mixed hyperlipidemia: Secondary | ICD-10-CM

## 2024-01-24 DIAGNOSIS — I2510 Atherosclerotic heart disease of native coronary artery without angina pectoris: Secondary | ICD-10-CM | POA: Diagnosis not present

## 2024-01-24 DIAGNOSIS — I10 Essential (primary) hypertension: Secondary | ICD-10-CM

## 2024-01-24 DIAGNOSIS — E6609 Other obesity due to excess calories: Secondary | ICD-10-CM

## 2024-01-24 NOTE — Patient Instructions (Signed)
 Medication Instructions:  Your physician recommends that you continue on your current medications as directed. Please refer to the Current Medication list given to you today.  *If you need a refill on your cardiac medications before your next appointment, please call your pharmacy*  Lab Work: Your physician recommends that you return for lab work in: 3 months (around 8/20) for Fasting Lipid Panel, LPa and CMP Lab opens at 8am. You DO NOT NEED an appointment. Best time to come is between 8am and 11:45 and between 1:30 and 4:30. If you have been asked to fast for your blood work please have nothing to eat or drink after midnight. You may have water.   If you have labs (blood work) drawn today and your tests are completely normal, you will receive your results only by: MyChart Message (if you have MyChart) OR A paper copy in the mail If you have any lab test that is abnormal or we need to change your treatment, we will call you to review the results.  Testing/Procedures: Your physician has requested that you have a carotid duplex. This test is an ultrasound of the carotid arteries in your neck. It looks at blood flow through these arteries that supply the brain with blood. Allow one hour for this exam. There are no restrictions or special instructions.   Follow-Up: At [facility], you and your health needs are our priority.  As part of our continuing mission to provide you with exceptional heart care, our providers are all part of one team.  This team includes your primary Cardiologist (physician) and Advanced Practice Providers or APPs (Physician Assistants and Nurse Practitioners) who all work together to provide you with the care you need, when you need it.  Your next appointment:   6 month(s)  Provider:   Zoe Hinds, MD    We recommend signing up for the patient portal called "MyChart".  Sign up information is provided on this After Visit Summary.  MyChart is used to connect  with patients for Virtual Visits (Telemedicine).  Patients are able to view lab/test results, encounter notes, upcoming appointments, etc.  Non-urgent messages can be sent to your provider as well.   To learn more about what you can do with MyChart, go to [email or web address].   Other Instructions

## 2024-01-27 DIAGNOSIS — E669 Obesity, unspecified: Secondary | ICD-10-CM | POA: Diagnosis not present

## 2024-01-27 DIAGNOSIS — R300 Dysuria: Secondary | ICD-10-CM | POA: Diagnosis not present

## 2024-01-27 DIAGNOSIS — K644 Residual hemorrhoidal skin tags: Secondary | ICD-10-CM | POA: Diagnosis not present

## 2024-01-27 DIAGNOSIS — K648 Other hemorrhoids: Secondary | ICD-10-CM | POA: Diagnosis not present

## 2024-01-27 DIAGNOSIS — N411 Chronic prostatitis: Secondary | ICD-10-CM | POA: Diagnosis not present

## 2024-01-31 ENCOUNTER — Other Ambulatory Visit (HOSPITAL_BASED_OUTPATIENT_CLINIC_OR_DEPARTMENT_OTHER): Admitting: Radiology

## 2024-01-31 ENCOUNTER — Ambulatory Visit (INDEPENDENT_AMBULATORY_CARE_PROVIDER_SITE_OTHER)
Admission: RE | Admit: 2024-01-31 | Discharge: 2024-01-31 | Disposition: A | Discharge status: Home / Self Care | Source: Ambulatory Visit | Attending: Cardiology | Admitting: Cardiology

## 2024-01-31 DIAGNOSIS — I10 Essential (primary) hypertension: Secondary | ICD-10-CM

## 2024-01-31 DIAGNOSIS — I2510 Atherosclerotic heart disease of native coronary artery without angina pectoris: Secondary | ICD-10-CM

## 2024-01-31 DIAGNOSIS — I6523 Occlusion and stenosis of bilateral carotid arteries: Secondary | ICD-10-CM | POA: Diagnosis not present

## 2024-01-31 DIAGNOSIS — E785 Hyperlipidemia, unspecified: Secondary | ICD-10-CM | POA: Diagnosis not present

## 2024-02-01 ENCOUNTER — Ambulatory Visit (HOSPITAL_BASED_OUTPATIENT_CLINIC_OR_DEPARTMENT_OTHER): Payer: Self-pay | Admitting: Family

## 2024-02-06 ENCOUNTER — Other Ambulatory Visit (HOSPITAL_BASED_OUTPATIENT_CLINIC_OR_DEPARTMENT_OTHER): Payer: Self-pay | Admitting: Family Medicine

## 2024-02-06 DIAGNOSIS — E042 Nontoxic multinodular goiter: Secondary | ICD-10-CM

## 2024-02-07 ENCOUNTER — Ambulatory Visit (INDEPENDENT_AMBULATORY_CARE_PROVIDER_SITE_OTHER)
Admission: RE | Admit: 2024-02-07 | Discharge: 2024-02-07 | Disposition: A | Discharge status: Home / Self Care | Source: Ambulatory Visit | Attending: Family Medicine | Admitting: Family Medicine

## 2024-02-07 DIAGNOSIS — E042 Nontoxic multinodular goiter: Secondary | ICD-10-CM | POA: Diagnosis not present

## 2024-03-27 DIAGNOSIS — M542 Cervicalgia: Secondary | ICD-10-CM | POA: Diagnosis not present

## 2024-03-27 DIAGNOSIS — Z981 Arthrodesis status: Secondary | ICD-10-CM | POA: Diagnosis not present

## 2024-03-27 DIAGNOSIS — M545 Low back pain, unspecified: Secondary | ICD-10-CM | POA: Diagnosis not present

## 2024-03-27 DIAGNOSIS — G894 Chronic pain syndrome: Secondary | ICD-10-CM | POA: Diagnosis not present

## 2024-04-02 DIAGNOSIS — J300 Vasomotor rhinitis: Secondary | ICD-10-CM | POA: Diagnosis not present

## 2024-04-02 DIAGNOSIS — J329 Chronic sinusitis, unspecified: Secondary | ICD-10-CM | POA: Diagnosis not present

## 2024-04-02 DIAGNOSIS — J3089 Other allergic rhinitis: Secondary | ICD-10-CM | POA: Diagnosis not present

## 2024-04-05 ENCOUNTER — Other Ambulatory Visit: Payer: Self-pay | Admitting: Allergy and Immunology

## 2024-04-19 DIAGNOSIS — M542 Cervicalgia: Secondary | ICD-10-CM | POA: Diagnosis not present

## 2024-04-19 DIAGNOSIS — D485 Neoplasm of uncertain behavior of skin: Secondary | ICD-10-CM | POA: Diagnosis not present

## 2024-04-19 DIAGNOSIS — G4486 Cervicogenic headache: Secondary | ICD-10-CM | POA: Diagnosis not present

## 2024-04-19 DIAGNOSIS — M4322 Fusion of spine, cervical region: Secondary | ICD-10-CM | POA: Diagnosis not present

## 2024-04-19 DIAGNOSIS — M5412 Radiculopathy, cervical region: Secondary | ICD-10-CM | POA: Diagnosis not present

## 2024-04-19 DIAGNOSIS — L570 Actinic keratosis: Secondary | ICD-10-CM | POA: Diagnosis not present

## 2024-05-02 ENCOUNTER — Other Ambulatory Visit: Payer: Self-pay | Admitting: Allergy and Immunology

## 2024-05-02 ENCOUNTER — Other Ambulatory Visit: Payer: Self-pay | Admitting: Physician Assistant

## 2024-05-02 DIAGNOSIS — M5412 Radiculopathy, cervical region: Secondary | ICD-10-CM

## 2024-05-07 ENCOUNTER — Other Ambulatory Visit: Payer: Self-pay | Admitting: Cardiology

## 2024-05-15 DIAGNOSIS — Z23 Encounter for immunization: Secondary | ICD-10-CM | POA: Diagnosis not present

## 2024-05-28 DIAGNOSIS — M47812 Spondylosis without myelopathy or radiculopathy, cervical region: Secondary | ICD-10-CM | POA: Diagnosis not present

## 2024-05-28 DIAGNOSIS — Z981 Arthrodesis status: Secondary | ICD-10-CM | POA: Diagnosis not present

## 2024-05-28 DIAGNOSIS — G894 Chronic pain syndrome: Secondary | ICD-10-CM | POA: Diagnosis not present

## 2024-05-28 DIAGNOSIS — M542 Cervicalgia: Secondary | ICD-10-CM | POA: Diagnosis not present

## 2024-05-30 DIAGNOSIS — D649 Anemia, unspecified: Secondary | ICD-10-CM | POA: Diagnosis not present

## 2024-05-30 DIAGNOSIS — Z125 Encounter for screening for malignant neoplasm of prostate: Secondary | ICD-10-CM | POA: Diagnosis not present

## 2024-06-03 ENCOUNTER — Other Ambulatory Visit: Payer: Self-pay | Admitting: Allergy and Immunology

## 2024-06-11 ENCOUNTER — Other Ambulatory Visit: Payer: Self-pay | Admitting: Cardiology

## 2024-07-12 DIAGNOSIS — C44629 Squamous cell carcinoma of skin of left upper limb, including shoulder: Secondary | ICD-10-CM | POA: Diagnosis not present

## 2024-07-17 ENCOUNTER — Other Ambulatory Visit: Payer: Self-pay | Admitting: Allergy and Immunology

## 2024-07-17 DIAGNOSIS — M545 Low back pain, unspecified: Secondary | ICD-10-CM | POA: Diagnosis not present

## 2024-07-17 DIAGNOSIS — G894 Chronic pain syndrome: Secondary | ICD-10-CM | POA: Diagnosis not present

## 2024-07-17 DIAGNOSIS — J4540 Moderate persistent asthma, uncomplicated: Secondary | ICD-10-CM

## 2024-07-17 DIAGNOSIS — M4722 Other spondylosis with radiculopathy, cervical region: Secondary | ICD-10-CM | POA: Diagnosis not present

## 2024-07-17 DIAGNOSIS — M542 Cervicalgia: Secondary | ICD-10-CM | POA: Diagnosis not present

## 2024-07-17 DIAGNOSIS — G8929 Other chronic pain: Secondary | ICD-10-CM | POA: Diagnosis not present

## 2024-07-25 DIAGNOSIS — D0439 Carcinoma in situ of skin of other parts of face: Secondary | ICD-10-CM | POA: Diagnosis not present

## 2024-07-31 DIAGNOSIS — M1711 Unilateral primary osteoarthritis, right knee: Secondary | ICD-10-CM | POA: Diagnosis not present

## 2024-08-16 ENCOUNTER — Other Ambulatory Visit: Payer: Self-pay | Admitting: Cardiology

## 2024-08-16 DIAGNOSIS — R051 Acute cough: Secondary | ICD-10-CM | POA: Diagnosis not present

## 2024-08-16 DIAGNOSIS — R509 Fever, unspecified: Secondary | ICD-10-CM | POA: Diagnosis not present

## 2024-08-16 DIAGNOSIS — R0981 Nasal congestion: Secondary | ICD-10-CM | POA: Diagnosis not present

## 2024-08-21 ENCOUNTER — Other Ambulatory Visit: Payer: Self-pay

## 2024-08-21 ENCOUNTER — Telehealth: Payer: Self-pay | Admitting: Cardiology

## 2024-08-21 MED ORDER — CLONIDINE HCL 0.1 MG PO TABS: 0.1000 mg | ORAL_TABLET | Freq: Two times a day (BID) | ORAL | 1 refills | Status: AC

## 2024-08-21 NOTE — Telephone Encounter (Signed)
 Per Dr. Monetta, sent in refill for Clonidine .

## 2024-08-21 NOTE — Telephone Encounter (Signed)
°*  STAT* If patient is at the pharmacy, call can be transferred to refill team.   1. Which medications need to be refilled? (please list name of each medication and dose if known) cloNIDine  (CATAPRES ) 0.1 MG tablet [[id number]]     4. Which pharmacy/location (including street and city if local pharmacy) is medication to be sent to? [id number] [place], [place] - [address] Phone: [phone]  Fax: [phone]       5. Do they need a 30 day or 90 day supply? 90

## 2024-08-23 NOTE — Progress Notes (Unsigned)
 Cardiology Office Note:    Date:  08/23/2024   ID:  Terry Stanley, DOB [birth year], MRN [id number]  PCP:  Street, Lonni HERO, Stanley  Cardiologist:  Redell Leiter, Stanley    Referring Stanley: Street, Lonni HERO, Stanley    ASSESSMENT:    No diagnosis found. PLAN:    In order of problems listed above:  ***   Next appointment: ***   Medication Adjustments/Labs and Tests Ordered: Current medicines are reviewed at length with the patient today.  Concerns regarding medicines are outlined above.  No orders of the defined types were placed in this encounter.  No orders of the defined types were placed in this encounter.    History of Present Illness:    Terry Stanley is a 63 y.o. male with a hx of CAD previously resistant hypertension and hyper lipidemia last seen 07/04/2023.His cardiac CTA showed a calcium  score of 88th percentile and moderate CAD 50 to 60% LAD less than 50% left circumflex and marginal stenosis Compliance with diet, lifestyle and medications: *** Past Medical History:  Diagnosis Date   Asthma 05/26/2017   CAD (coronary artery disease) 05/07/2022   Cervical radiculopathy due to degenerative joint disease of spine 06/12/2019   Cervical radiculopathy due to degenerative joint disease of spine 06/12/2019   Edema 04/07/2018   Elevated coronary artery calcium  score 08/01/2019   Essential hypertension 08/23/2016   Hyperlipidemia 10/15/2011   Left sided sciatica 06/02/2022   Low back pain 10/15/2011   Neoplasm of skin of upper limb or shoulder, benign 01/30/2016   PONV (postoperative nausea and vomiting) 05/26/2017   Pre-operative cardiovascular examination 08/23/2016   Resistant hypertension 08/23/2016   Spinal stenosis of lumbar region 10/15/2011   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 The study is low risk.   Left ventricular function is normal. Nuclear stress EF: 57 %. The left ventricular ejection fraction is normal (55-65%). End diastolic cavity size is normal.   Prior study not available for comparison.   ECHOCARDIOGRAM  ECHOCARDIOGRAM COMPLETE 05/19/2022  Narrative ECHOCARDIOGRAM REPORT    Patient Name:   Terry Stanley Date of Exam: 05/19/2022 Medical Rec #:  [id number]     Height:       72.0 in Accession #:    [id number]    Weight:       245.0 lb Date of Birth:  07-Oct-1960     BSA:          2.322 m Patient Age:    60 years      BP:           110/70 mmHg Patient Gender: M             HR:           58 bpm. Exam Location:  [facility]  Procedure: 2D Echo, Cardiac Doppler, Color Doppler and Strain Analysis  Indications:    Coronary artery disease involving native coronary artery of native heart without angina pectoris [I25.10 (ICD-10-CM)]; Mixed hyperlipidemia [E78.2 (ICD-10-CM)]; Essential hypertension [I10 (ICD-10-CM)]; Pre-operative cardiovascular examination [Z01.810 (ICD-10-CM)]  History:        Patient has no prior history of Echocardiogram examinations. CAD; Risk Factors:Hypertension and Dyslipidemia.  Sonographer:    Charlie Jointer RDCS Referring Phys: CYRUS JENNIFER SAUNDERS [facility]  IMPRESSIONS   1. Left ventricular ejection fraction, by estimation, is 60 to 65%. The left ventricle has normal function. The left ventricle has no regional wall motion abnormalities. Left ventricular diastolic parameters were normal. 2. Right ventricular systolic function is normal. The right ventricular size is normal. 3. Left atrial size was mildly dilated. 4. The mitral valve is normal in structure. Mild mitral valve  regurgitation. No evidence of mitral stenosis. 5. The aortic valve is normal in structure. Aortic valve regurgitation is not visualized. Aortic valve sclerosis is present, with no evidence of aortic valve stenosis. 6. The inferior vena cava is normal in size with greater than 50% respiratory variability, suggesting right atrial pressure of 3 mmHg.  FINDINGS Left Ventricle: Left ventricular ejection fraction, by estimation, is 60 to 65%. The left ventricle has normal function. The left ventricle has no regional wall motion abnormalities. The left ventricular internal cavity size was normal in size. There is no left ventricular hypertrophy. Left ventricular diastolic parameters were normal.  Right Ventricle: The right ventricular size is normal. No increase in right ventricular wall thickness. Right ventricular systolic function is normal.  Left Atrium: Left atrial size was mildly dilated.  Right Atrium: Right atrial size was normal in size.  Pericardium: There is no evidence of pericardial effusion.  Mitral Valve: The mitral valve is normal in structure. Mild mitral valve regurgitation. No evidence of mitral valve stenosis.  Tricuspid Valve: The tricuspid valve is normal in structure. Tricuspid valve regurgitation is not demonstrated. No evidence of tricuspid stenosis.  Aortic Valve: The aortic valve is normal in structure. Aortic valve regurgitation is not visualized. Aortic valve sclerosis is present, with no evidence of aortic valve stenosis.  Pulmonic Valve: The pulmonic valve was normal in structure. Pulmonic valve regurgitation is not visualized. No evidence of pulmonic stenosis.  Aorta: The aortic root is normal in size and structure.  Venous: The inferior vena cava is normal in size with greater than 50% respiratory variability, suggesting right atrial pressure of 3 mmHg.  IAS/Shunts: No atrial level shunt detected by color flow Doppler.   LEFT VENTRICLE PLAX 2D LVIDd:          5.20 cm   Diastology LVIDs:         3.30 cm   LV e' medial:    7.29 cm/s LV PW:         1.10 cm   LV E/e' medial:  9.6 LV IVS:        1.40 cm   LV e' lateral:   9.25 cm/s LVOT diam:     2.20 cm   LV E/e' lateral: 7.6 LV SV:         104 LV SV Index:   45 LVOT Area:     3.80 cm   RIGHT VENTRICLE             IVC RV Basal diam:  3.00 cm     IVC diam: 2.00 cm RV S prime:     12.50 cm/s TAPSE (M-mode): 2.6 cm  LEFT ATRIUM             Index        RIGHT ATRIUM           Index LA diam:        4.80 cm 2.07 cm/m   RA Area:     19.20 cm LA Vol (A2C):   93.5  ml 40.26 ml/m  RA Volume:   53.30 ml  22.95 ml/m LA Vol (A4C):   66.2 ml 28.50 ml/m LA Biplane Vol: 79.6 ml 34.27 ml/m AORTIC VALVE LVOT Vmax:   96.60 cm/s LVOT Vmean:  71.300 cm/s LVOT VTI:    0.273 m  AORTA Ao Root diam: 3.30 cm Ao Asc diam:  3.50 cm Ao Desc diam: 2.40 cm  MITRAL VALVE MV Area (PHT): 3.99 cm    SHUNTS MV Decel Time: 190 msec    Systemic VTI:  0.27 m MR Peak grad: 109.8 mmHg   Systemic Diam: 2.20 cm MR Vmax:      524.00 cm/s MV E velocity: 70.30 cm/s MV A velocity: 55.70 cm/s MV E/A ratio:  1.26  Terry Stanley Electronically signed by Terry Stanley Signature Date/Time: 05/19/2022/12:21:26 PM    Final      CT SCANS  CT CORONARY MORPH W/CTA COR W/SCORE 11/14/2019  Addendum 11/14/2019  7:44 PM ADDENDUM REPORT: 11/14/2019 19:42  HISTORY: 63 yo male CAD eval, high risk, asymptomatic, prior calcium  score 100-400  EXAM: Cardiac/Coronary CTA  TECHNIQUE: The patient was scanned on a [employer].  PROTOCOL: A 120 kV prospective scan was triggered in the descending thoracic aorta at 111 HU's. Axial non-contrast 3 mm slices were carried out through the heart. The data set was analyzed on a dedicated work station and scored using the Agatson method. Gantry rotation speed was 250 msecs and collimation was .6 mm. Beta blockade and 0.8 mg of sl NTG was given. The 3D data set  was reconstructed in 5% intervals of the 67-82 % of the R-R cycle. Diastolic phases were analyzed on a dedicated work station using MPR, MIP and VRT modes. The patient received 80mL OMNIPAQUE  IOHEXOL  350 MG/ML SOLN of contrast.  FINDINGS: Quality: Excellent (HR 52)  Coronary calcium  score: The patient's coronary artery calcium  score is 344, which places the patient in the 89th percentile.  Coronary arteries: Normal coronary origins.  Right dominance.  Right Coronary Artery: Dominant vessel. Gives of the R-PDA and R-PLB branches. Minimal 1-24% proximal mixed stenosis (CADRADS1). There is moderate diffuse mixed 50-69% (CADRADS3) mid vessel stenosis that is not likely flow-limiting. Minimal mixed proximal R-PLB branch stenosis (CADRADS1).  Left Main Coronary Artery: Normal left main. Bifurcates into the LAD and LCx arteries.  Left Anterior Descending Coronary Artery: Mild mixed 25-49% ostial LAD stenosis (CADRADS 2). Proximal minimal 1-24% mixed stenosis (CADRADS1) with mild non-calcified diffuse distal disease. There are 2 smaller proximal diagonal vessels without stenosis.  Left Circumflex Artery: Minimal 1-24% mixed proximal stenosis (CADRADS1). The artery trifurcates, giving off a moderate-sized OM branch with mild 25-49% disease (CADRADS2) and a smaller OM2 branch without disease.  Aorta: Normal size, 33 mm at the mid ascending aorta (level of the PA bifurcation) measured double oblique. Aortic atherosclerosis. No dissection.  Aortic Valve: Tricuspid.  No calcifications.  Other findings:  Normal pulmonary vein drainage into the left atrium.  Normal left atrial appendage without a thrombus.  Dilated main pulmonary artery at 30 mm, suggestive of pulmonary hypertension.  IMPRESSION: 1. Mild to moderate multivessel mixed CAD, worse in the RCA, CADRADS = 3, likely non-obstructive. CT FFR will be performed and reported separately.  2. Coronary calcium  score of 344.  This was 89th percentile for age and sex matched control. Multivessel CAC noted.  3. Normal coronary origin with right dominance.  4.  Dilated main PA at 30 mm, suggestive of pulmonary hypertension.  5. Aggressive cardiovascular risk  factor modification is encouraged.   Electronically Signed By: Terry JAYSON Maxcy M.D. On: 11/14/2019 19:42  Narrative EXAM: OVER-READ INTERPRETATION  CT CHEST  The following report is an over-read performed by radiologist Dr. Franky Stanley of [facility] on 11/14/2019. This over-read does not include interpretation of cardiac or coronary anatomy or pathology. The coronary CTA interpretation by the cardiologist is attached.  COMPARISON:  None.  FINDINGS: Vascular: Heart is normal size.  Visualized aorta normal caliber.  Mediastinum/Nodes: No adenopathy in the lower mediastinum or hila.  Lungs/Pleura: Small calcified granulomas in the right lung. Noncalcified small peripheral nodules in the right lower lobe, the largest 6-7 mm on image 17. Adjacent smaller nodule peripherally on image 22. No confluent opacities or effusions.  Upper Abdomen: Imaging into the upper abdomen shows no acute findings.  Musculoskeletal: Chest wall soft tissues are unremarkable. No acute bony abnormality.  IMPRESSION: Old granulomatous disease.  Small subpleural nodules in the right lower lobe posteriorly, the largest 6-7 mm. Non-contrast chest CT at 3-6 months is recommended. If the nodules are stable at time of repeat CT, then future CT at 18-24 months (from today's scan) is considered optional for low-risk patients, but is recommended for high-risk patients. This recommendation follows the consensus statement: Guidelines for Management of Incidental Pulmonary Nodules Detected on CT Images: From the Fleischner Society 2017; Radiology 2017; 284:228-243.  Electronically Signed: By: Terry Stanley M.D. On: 11/14/2019 08:39   CT CORONARY FRACTIONAL  FLOW RESERVE DATA PREP 11/14/2019  Narrative EXAM: CT FFR ANALYSIS  CLINICAL DATA:  CAD eval, high risk, asymptomatic, prior calcium  score 100-400 abnormal calcium  scoring  FINDINGS: FFRct analysis was performed on the original cardiac CT angiogram dataset. Diagrammatic representation of the FFRct analysis is provided in a separate PDF document in PACS. This dictation was created using the PDF document and an interactive 3D model of the results. 3D model is not available in the EMR/PACS. Normal FFR range is >0.80.  1. Left Main:  No significant stenosis. FFR = 1.00  2. LAD: No significant stenosis. Proximal FFR = 0.94, Mid FFR = 0.94, Distal FFR = 0.79 (very small vessel at this point) 3. LCX: No significant stenosis. Proximal FFR = 0.95, Distal FFR = 0.82 4. RCA: No significant stenosis. Proximal FFR = 1.00, Mid FFR = 0.97, Distal FFR = 0.92  IMPRESSION: 1.  CT FFR analysis did not show any significant stenosis.   Electronically Signed By: Terry JAYSON Maxcy M.D. On: 11/15/2019 09:44   CT SCANS  CT CARDIAC SCORING (SELF PAY ONLY) 07/26/2019  Addendum 07/26/2019  4:05 PM ADDENDUM REPORT: 07/26/2019 15:36  ADDENDUM: OVER-READ INTERPRETATION  CT CHEST  The following report is an over-read performed by radiologist Dr. Rockey Stanley [facility] on 07/26/2019. This over-read does not include interpretation of cardiac or coronary anatomy or pathology. The calcium  score interpretation by the cardiologist is attached.  COMPARISON:  None.  FINDINGS:  Vascular: Aortic atherosclerosis.  Mediastinum/Nodes: No imaged thoracic adenopathy.  Lungs/Pleura: No imaged pleural fluid. Mild centrilobular emphysema. Tiny well-circumscribed pulmonary nodules, the majority of which are felt to be calcified bilaterally. A right lower lobe subpleural pulmonary nodule of 4 mm on 30/3 is not calcified.  Upper Abdomen: Normal imaged portions of the liver,  stomach.  Musculoskeletal: No acute osseous abnormality. Lower thoracic spondylosis.  IMPRESSION:  1.  No acute findings in the imaged extracardiac chest. 2.  Emphysema (ICD10-J43.9). 3. Multiple pulmonary nodules, primarily calcified granulomas. Right lower lobe pulmonary nodule of 4  mm is not calcified. Non-contrast chest CT can be considered in 12 months, given risk factors for primary bronchogenic carcinoma. This recommendation follows the consensus statement: Guidelines for Management of Incidental Pulmonary Nodules Detected on CT Images: From the Fleischner Society 2017; Radiology 2017; 284:228-243.   Electronically Signed By: Terry Stanley M.D. On: 07/26/2019 15:36  Narrative CLINICAL DATA:  Risk stratification  EXAM: Coronary Calcium  Score  TECHNIQUE: The patient was scanned on a [employer]. Axial non-contrast 3 mm slices were carried out through the heart. The data set was analyzed on a dedicated work station and scored using the Agatson method.  FINDINGS: Non-cardiac: See separate report from [facility].  Ascending Aorta: Normal Caliber.  Calcifications noted.  Pericardium: Normal  Coronary arteries: Normal coronary origins. Coronary calcifications in the LAD, RCA and LCx.  IMPRESSION: Coronary calcium  score of 300. This was 88th percentile for age and sex matched control.  Terry Stanley  Electronically Signed: By: Terry Stanley On: 07/26/2019 10:53     ______________________________________________________________________________________________          Recent Labs: No results found for requested labs within last 365 days.  Recent Lipid Panel    Component Value Date/Time   CHOL 165 07/04/2023 0852   TRIG 192 (H) 07/04/2023 0852   HDL 40 07/04/2023 0852   CHOLHDL 4.1 07/04/2023 0852   LDLCALC 92 07/04/2023 0852    Physical Exam:    VS:  There were no vitals taken for this visit.    Wt Readings from Last 3  Encounters:  01/24/24 236 lb 3.2 oz (107.1 kg)  08/08/23 251 lb 3.2 oz (113.9 kg)  07/04/23 248 lb (112.5 kg)     GEN: *** Well nourished, well developed in no acute distress HEENT: Normal NECK: No JVD; No carotid bruits LYMPHATICS: No lymphadenopathy CARDIAC: ***RRR, no murmurs, rubs, gallops RESPIRATORY:  Clear to auscultation without rales, wheezing or rhonchi  ABDOMEN: Soft, non-tender, non-distended MUSCULOSKELETAL:  No edema; No deformity  SKIN: Warm and dry NEUROLOGIC:  Alert and oriented x 3 PSYCHIATRIC:  Normal affect    Signed, Redell Leiter, Stanley  08/23/2024 7:26 PM    [facility]     [1]  No outpatient medications have been marked as taking for the 08/24/24 encounter (Appointment) with Leiter Redell PARAS, Stanley.

## 2024-08-24 ENCOUNTER — Ambulatory Visit: Attending: Cardiology | Admitting: Cardiology

## 2024-08-24 ENCOUNTER — Encounter: Payer: Self-pay | Admitting: Cardiology

## 2024-08-24 VITALS — BP 146/88 | HR 62 | Ht <= 58 in | Wt 245.4 lb

## 2024-08-24 DIAGNOSIS — I2510 Atherosclerotic heart disease of native coronary artery without angina pectoris: Secondary | ICD-10-CM

## 2024-08-24 DIAGNOSIS — I10 Essential (primary) hypertension: Secondary | ICD-10-CM

## 2024-08-24 DIAGNOSIS — E782 Mixed hyperlipidemia: Secondary | ICD-10-CM | POA: Diagnosis not present

## 2024-08-24 MED ORDER — NITROGLYCERIN 0.4 MG SL SUBL: 0.4000 mg | SUBLINGUAL_TABLET | SUBLINGUAL | 3 refills | Status: AC | PRN

## 2024-08-24 NOTE — Patient Instructions (Signed)
 Medication Instructions:  Your physician recommends that you continue on your current medications as directed. Please refer to the Current Medication list given to you today.  *If you need a refill on your cardiac medications before your next appointment, please call your pharmacy*  Lab Work: Your physician recommends that you return for lab work in:   Labs today: CMP, Lipids, Apo B  If you have labs (blood work) drawn today and your tests are completely normal, you will receive your results only by: MyChart Message (if you have MyChart) OR A paper copy in the mail If you have any lab test that is abnormal or we need to change your treatment, we will call you to review the results.  Testing/Procedures: None  Follow-Up: At [facility], you and your health needs are our priority.  As part of our continuing mission to provide you with exceptional heart care, our providers are all part of one team.  This team includes your primary Cardiologist (physician) and Advanced Practice Providers or APPs (Physician Assistants and Nurse Practitioners) who all work together to provide you with the care you need, when you need it.  Your next appointment:   9 month(s)  Provider:   Redell Leiter, MD    We recommend signing up for the patient portal called MyChart.  Sign up information is provided on this After Visit Summary.  MyChart is used to connect with patients for Virtual Visits (Telemedicine).  Patients are able to view lab/test results, encounter notes, upcoming appointments, etc.  Non-urgent messages can be sent to your provider as well.   To learn more about what you can do with MyChart, go to [email or web address].   Other Instructions Please keep a BP log for 2 weeks and check blood pressure twice daily and send by MyChart or mail.                      Dr. Leiter [address] [place], [place] [address]  Blood Pressure Record Sheet To take your blood pressure, you will need a  blood pressure machine. You can buy a blood pressure machine (blood pressure monitor) at your clinic, drug store, or online. When choosing one, consider: An automatic monitor that has an arm cuff. A cuff that wraps snugly around your upper arm. You should be able to fit only one finger between your arm and the cuff. A device that stores blood pressure reading results. Do not choose a monitor that measures your blood pressure from your wrist or finger. Follow your health care provider's instructions for how to take your blood pressure. To use this form: Get one reading in the morning (a.m.) 1-2 hours after you take any medicines. Get one reading in the evening (p.m.) before supper.   Blood pressure log Date: _______________________  a.m. _____________________(1st reading) HR___________            p.m. _____________________(2nd reading) HR__________  Date: _______________________  a.m. _____________________(1st reading) HR___________            p.m. _____________________(2nd reading) HR__________  Date: _______________________  a.m. _____________________(1st reading) HR___________            p.m. _____________________(2nd reading) HR__________  Date: _______________________  a.m. _____________________(1st reading) HR___________            p.m. _____________________(2nd reading) HR__________  Date: _______________________  a.m. _____________________(1st reading) HR___________            p.m. _____________________(2nd reading) HR__________  Date: _______________________  a.m.  _____________________(1st reading) HR___________            p.m. _____________________(2nd reading) HR__________  Date: _______________________  a.m. _____________________(1st reading) HR___________            p.m. _____________________(2nd reading) HR__________   This information is not intended to replace advice given to you by your health care provider. Make sure you discuss any questions you  have with your health care provider. Document Revised: 12/12/2019 Document Reviewed: 12/12/2019 Elsevier Patient Education  2021 [employer].

## 2024-08-25 LAB — APOLIPOPROTEIN B: Apolipoprotein B: 92 mg/dL — ABNORMAL HIGH

## 2024-08-25 LAB — COMPREHENSIVE METABOLIC PANEL WITH GFR
ALT: 30 IU/L (ref 0–44)
AST: 22 IU/L (ref 0–40)
Albumin: 4.9 g/dL (ref 3.9–4.9)
Alkaline Phosphatase: 103 IU/L (ref 47–123)
BUN/Creatinine Ratio: 16 (ref 10–24)
BUN: 17 mg/dL (ref 8–27)
Bilirubin Total: 0.5 mg/dL (ref 0.0–1.2)
CO2: 22 mmol/L (ref 20–29)
Calcium: 9.8 mg/dL (ref 8.6–10.2)
Chloride: 94 mmol/L — ABNORMAL LOW (ref 96–106)
Creatinine, Ser: 1.05 mg/dL (ref 0.76–1.27)
Globulin, Total: 2.5 g/dL (ref 1.5–4.5)
Glucose: 93 mg/dL (ref 70–99)
Potassium: 4.4 mmol/L (ref 3.5–5.2)
Sodium: 132 mmol/L — ABNORMAL LOW (ref 134–144)
Total Protein: 7.4 g/dL (ref 6.0–8.5)
eGFR: 80 mL/min/1.73

## 2024-08-25 LAB — LIPID PANEL
Chol/HDL Ratio: 3.5 ratio (ref 0.0–5.0)
Cholesterol, Total: 184 mg/dL (ref 100–199)
HDL: 53 mg/dL
LDL Chol Calc (NIH): 84 mg/dL (ref 0–99)
Triglycerides: 286 mg/dL — ABNORMAL HIGH (ref 0–149)
VLDL Cholesterol Cal: 47 mg/dL — ABNORMAL HIGH (ref 5–40)

## 2024-08-26 ENCOUNTER — Ambulatory Visit: Payer: Self-pay | Admitting: Cardiology

## 2024-08-27 ENCOUNTER — Other Ambulatory Visit: Payer: Self-pay

## 2024-08-27 DIAGNOSIS — E785 Hyperlipidemia, unspecified: Secondary | ICD-10-CM

## 2024-08-27 MED ORDER — EZETIMIBE 10 MG PO TABS: 10.0000 mg | ORAL_TABLET | Freq: Every day | ORAL | 3 refills | Status: AC

## 2024-09-20 ENCOUNTER — Other Ambulatory Visit: Payer: Self-pay | Admitting: Cardiology
